# Patient Record
Sex: Male | Born: 1975 | Race: White | Hispanic: No | Marital: Married | Smoking: Former smoker
Health system: Southern US, Community
[De-identification: ages and names within clinical notes are randomized; demographics above are authoritative.]

## PROBLEM LIST (undated history)

## (undated) DIAGNOSIS — R809 Proteinuria, unspecified: Secondary | ICD-10-CM

## (undated) DIAGNOSIS — N529 Male erectile dysfunction, unspecified: Secondary | ICD-10-CM

## (undated) DIAGNOSIS — L309 Dermatitis, unspecified: Secondary | ICD-10-CM

## (undated) DIAGNOSIS — E785 Hyperlipidemia, unspecified: Secondary | ICD-10-CM

## (undated) DIAGNOSIS — M329 Systemic lupus erythematosus, unspecified: Secondary | ICD-10-CM

## (undated) DIAGNOSIS — N059 Unspecified nephritic syndrome with unspecified morphologic changes: Secondary | ICD-10-CM

## (undated) DIAGNOSIS — I10 Essential (primary) hypertension: Secondary | ICD-10-CM

## (undated) DIAGNOSIS — J189 Pneumonia, unspecified organism: Secondary | ICD-10-CM

## (undated) HISTORY — DX: Essential (primary) hypertension: I10

## (undated) HISTORY — DX: Proteinuria, unspecified: R80.9

## (undated) HISTORY — DX: Systemic lupus erythematosus, unspecified: M32.9

## (undated) HISTORY — DX: Dermatitis, unspecified: L30.9

## (undated) HISTORY — DX: Hyperlipidemia, unspecified: E78.5

## (undated) HISTORY — PX: NO PAST SURGERIES: SHX2092

## (undated) HISTORY — DX: Male erectile dysfunction, unspecified: N52.9

## (undated) HISTORY — PX: RENAL BIOPSY: SHX156

## (undated) HISTORY — DX: Unspecified nephritic syndrome with unspecified morphologic changes: N05.9

---

## 2007-02-23 ENCOUNTER — Emergency Department: Payer: Self-pay | Admitting: Emergency Medicine

## 2010-02-04 DIAGNOSIS — N289 Disorder of kidney and ureter, unspecified: Secondary | ICD-10-CM | POA: Insufficient documentation

## 2010-10-03 ENCOUNTER — Other Ambulatory Visit: Payer: Self-pay | Admitting: Nephrology

## 2010-10-05 ENCOUNTER — Observation Stay: Payer: Self-pay | Admitting: Nephrology

## 2011-03-29 DIAGNOSIS — N052 Unspecified nephritic syndrome with diffuse membranous glomerulonephritis: Secondary | ICD-10-CM | POA: Insufficient documentation

## 2012-04-03 DIAGNOSIS — Z79899 Other long term (current) drug therapy: Secondary | ICD-10-CM | POA: Insufficient documentation

## 2015-06-23 DIAGNOSIS — M3214 Glomerular disease in systemic lupus erythematosus: Secondary | ICD-10-CM | POA: Insufficient documentation

## 2016-01-15 ENCOUNTER — Ambulatory Visit (INDEPENDENT_AMBULATORY_CARE_PROVIDER_SITE_OTHER): Payer: PRIVATE HEALTH INSURANCE | Admitting: Family Medicine

## 2016-01-15 ENCOUNTER — Other Ambulatory Visit: Payer: Self-pay

## 2016-01-15 ENCOUNTER — Ambulatory Visit
Admission: RE | Admit: 2016-01-15 | Discharge: 2016-01-15 | Disposition: A | Discharge status: Home / Self Care | Payer: 59 | Source: Ambulatory Visit | Attending: Family Medicine | Admitting: Family Medicine

## 2016-01-15 VITALS — BP 120/78 | HR 76 | Temp 98.5°F | Resp 16 | Wt 175.2 lb

## 2016-01-15 DIAGNOSIS — M3214 Glomerular disease in systemic lupus erythematosus: Secondary | ICD-10-CM | POA: Diagnosis not present

## 2016-01-15 DIAGNOSIS — R030 Elevated blood-pressure reading, without diagnosis of hypertension: Secondary | ICD-10-CM

## 2016-01-15 DIAGNOSIS — M25561 Pain in right knee: Secondary | ICD-10-CM

## 2016-01-15 DIAGNOSIS — R609 Edema, unspecified: Secondary | ICD-10-CM | POA: Insufficient documentation

## 2016-01-15 DIAGNOSIS — IMO0001 Reserved for inherently not codable concepts without codable children: Secondary | ICD-10-CM | POA: Insufficient documentation

## 2016-01-15 DIAGNOSIS — Z23 Encounter for immunization: Secondary | ICD-10-CM

## 2016-01-15 DIAGNOSIS — N529 Male erectile dysfunction, unspecified: Secondary | ICD-10-CM | POA: Diagnosis not present

## 2016-01-15 DIAGNOSIS — L309 Dermatitis, unspecified: Secondary | ICD-10-CM | POA: Insufficient documentation

## 2016-01-15 MED ORDER — SILDENAFIL CITRATE 20 MG PO TABS: ORAL_TABLET | ORAL | 0 refills | Status: DC

## 2016-01-15 NOTE — Progress Notes (Signed)
Patient: Timothy Best Male    DOB: 23-Jun-1975   40 y.o.   MRN: [id number] Visit Date: 01/15/2016  Today's Provider: Vernie Murders, PA   Chief Complaint  Patient presents with  . Knee Pain   Subjective:    Knee Pain   The incident occurred more than 1 week ago. The injury mechanism is unknown. The pain is present in the right knee. The quality of the pain is described as aching. The pain has been constant since onset. The symptoms are aggravated by weight bearing and movement. He has tried elevation, ice, rest and NSAIDs for the symptoms. The treatment provided mild relief.   Erectile Dysfunction Having difficulty attaining and maintaining erections over the past couple months.    Previous Medications   ATORVASTATIN (LIPITOR) 20 MG TABLET    Take by mouth.   BENAZEPRIL (LOTENSIN) 20 MG TABLET    Take by mouth.   HYDROXYCHLOROQUINE (PLAQUENIL) 200 MG TABLET    Take by mouth.   MULTIPLE VITAMIN PO    Take by mouth.   MYCOPHENOLATE (CELLCEPT) 500 MG TABLET    Take by mouth.   TRIAMCINOLONE CREAM (KENALOG) 0.1 %    TRIAMCINOLONE ACETONIDE, 0.1% (External Cream)  1 Cream Cream apply 2-3 x day to rash for 0 days  Quantity: 60;  Refills: 1   Ordered :20-Sep-2010  Meyer Cory ;  Started 20-Sep-2010 Active   VERAPAMIL (CALAN-SR) 180 MG CR TABLET    Take by mouth.    Review of Systems  Constitutional: Negative.   Respiratory: Negative.   Cardiovascular: Negative.   Musculoskeletal: Positive for arthralgias and joint swelling.    Social History  Substance Use Topics  . Smoking status: Former Smoker    Packs/day: 1.00    Years: 12.00    Types: Cigarettes  . Smokeless tobacco: Never Used  . Alcohol use Yes     Comment: occasionally    Objective:   BP 120/78 (BP Location: Right Arm, Patient Position: Sitting, Cuff Size: Normal)   Pulse 76   Temp 98.5 F (36.9 C) (Oral)   Resp 16   Wt 175 lb 3.2 oz (79.5 kg)   Physical Exam  Constitutional: He is oriented to  person, place, and time. He appears well-developed and well-nourished. No distress.  HENT:  Head: Normocephalic and atraumatic.  Right Ear: Hearing normal.  Left Ear: Hearing normal.  Nose: Nose normal.  Eyes: Conjunctivae and lids are normal. Right eye exhibits no discharge. Left eye exhibits no discharge. No scleral icterus.  Pulmonary/Chest: Effort normal. No respiratory distress.  Musculoskeletal: He exhibits tenderness.  Right knee without ligament laxity. Slight tenderness along the medial side of patella. No click or popping to test ROM.  Neurological: He is alert and oriented to person, place, and time.  Skin: Skin is intact. No lesion and no rash noted.  Psychiatric: He has a normal mood and affect. His speech is normal and behavior is normal. Thought content normal.      Assessment & Plan:     1. Acute pain of right knee Onset 3 days ago when he was trying to carry a mattress up a flight of stairs. Had a previous episode of discomfort and swelling after pretending to be involved in combat during a play. Landed on the right knee several times and had pain develop after playing disc golf the next day or two, 2 weeks ago. Has tried a small amount of Ibuprofen and an knee brace with slight improvement.  Will get x-ray evaluation and continue brace. - DG Knee Complete 4 Views Right  2. SLE glomerulonephritis syndrome (HCC) Stable and followed by Dr. Candiss Norse (nephrologist). Still taking Cellcept and Plaquenil with Lotensin to preserve renal function. Follow up with nephrologist and rheumatologist regularly.  3. Erectile dysfunction, unspecified erectile dysfunction type Unable to maintain or attain erections intermittently over the past couple months. Feels this is worsening and would like to try Viagra. Given a few to try as long as he makes his nephrologist aware of it to be sure no contraindication since he is taking Cellcept and Plaquenil. Call report of response. - sildenafil  (REVATIO) 20 MG tablet; Take 1-3 tablets by mouth 1-4 hours prior to intercourse - Limit one dose per day.  Dispense: 10 tablet; Refill: 0  4. Need for influenza vaccination  - Flu Vaccine QUAD 36+ mos PF IM (Fluarix & Fluzone Quad PF)

## 2016-01-16 ENCOUNTER — Telehealth: Payer: Self-pay | Admitting: Family Medicine

## 2016-01-16 NOTE — Telephone Encounter (Signed)
Pt called saying he checked with his specialist regarding taking the sildenafil.  He said you can call it into [facility]  Pt's call back is [phone]  Thanks, Con Memos

## 2016-01-16 NOTE — Telephone Encounter (Signed)
Fill the prescription he was given yesterday to give it a try. If it works well, can get refill at [facility]. X-ray of knee showed some mild effusion from inflammation. No bony or joint abnormalities. Continue present brace for support and inflammation medicine if needed. Recheck knee in 7-10 days if no better.

## 2016-01-16 NOTE — Telephone Encounter (Signed)
Patient advised.

## 2016-01-22 ENCOUNTER — Other Ambulatory Visit: Payer: Self-pay | Admitting: Family Medicine

## 2016-01-22 DIAGNOSIS — N529 Male erectile dysfunction, unspecified: Secondary | ICD-10-CM

## 2016-05-18 ENCOUNTER — Other Ambulatory Visit: Payer: Self-pay | Admitting: Family Medicine

## 2016-05-18 DIAGNOSIS — N529 Male erectile dysfunction, unspecified: Secondary | ICD-10-CM

## 2016-12-23 ENCOUNTER — Encounter: Payer: Self-pay | Admitting: Family Medicine

## 2016-12-23 ENCOUNTER — Ambulatory Visit: Payer: PRIVATE HEALTH INSURANCE | Admitting: Family Medicine

## 2016-12-23 VITALS — BP 118/78 | HR 75 | Temp 98.5°F | Wt 193.0 lb

## 2016-12-23 DIAGNOSIS — J0100 Acute maxillary sinusitis, unspecified: Secondary | ICD-10-CM

## 2016-12-23 DIAGNOSIS — J029 Acute pharyngitis, unspecified: Secondary | ICD-10-CM | POA: Diagnosis not present

## 2016-12-23 LAB — POCT RAPID STREP A (OFFICE): RAPID STREP A SCREEN: NEGATIVE

## 2016-12-23 MED ORDER — AMOXICILLIN 875 MG PO TABS: 875.0000 mg | ORAL_TABLET | Freq: Two times a day (BID) | ORAL | 0 refills | Status: DC

## 2016-12-23 NOTE — Progress Notes (Signed)
Patient: Timothy Best Male    DOB: 1975/09/22   41 y.o.   MRN: [id number] Visit Date: 12/23/2016  Today's Provider: Vernie Murders, PA   Chief Complaint  Patient presents with  . URI   Subjective:    URI   This is a new problem. Episode onset: 2 days ago. Maximum temperature: 99.6. Associated symptoms include congestion, coughing and a sore throat. Associated symptoms comments: Fever . He has tried acetaminophen and NSAIDs (Mucinex DM) for the symptoms. The treatment provided mild relief.  Girlfriend started with similar symptoms 1 week ago but she is better now.  Patient Active Problem List   Diagnosis Date Noted  . Accumulation of fluid in tissues 01/15/2016  . Blood pressure elevated 01/15/2016  . SLE glomerulonephritis syndrome ([place]) 01/15/2016  . Dermatitis, eczematoid 01/15/2016  . 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 H/O long-term treatment with high-risk medication 04/03/2012  . 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 NO PAST SURGERIES     Family History  Problem Relation Age of Onset  . Allergies Mother   . Clotting disorder Father   . Allergies Brother      No Known Allergies  Current Outpatient Medications:  .  benazepril (LOTENSIN) 20 MG tablet, Take by mouth., Disp: , Rfl:  .  triamcinolone cream (KENALOG) 0.1 %, TRIAMCINOLONE ACETONIDE, 0.1% (External Cream)  1 Cream Cream apply 2-3 x day to rash for 0 days  Quantity: 60;  Refills: 1   Ordered :20-Sep-2010  Meyer Cory ;  Started 20-Sep-2010 Active, Disp: , Rfl:  .  atorvastatin (LIPITOR) 20 MG tablet, Take by mouth., Disp: , Rfl:  .  hydroxychloroquine (PLAQUENIL) 200 MG tablet, Take by mouth., Disp: , Rfl:  .  MULTIPLE VITAMIN PO, Take by mouth., Disp: , Rfl:  .  mycophenolate (CELLCEPT) 500 MG tablet, Take by mouth., Disp: , Rfl:  .  sildenafil (REVATIO) 20 MG tablet, TAKE 1-3 TABLETS BY MOUTH 1-4  HOURS PRIOR TO INTERCOURSE - LIMIT 1 DOSE PER DAY (Patient not taking: Reported on 12/23/2016), Disp: 10 tablet, Rfl: 3 .  verapamil (CALAN-SR) 180 MG CR tablet, Take by mouth., Disp: , Rfl:   Review of Systems  Constitutional: Positive for fever.  HENT: Positive for congestion and sore throat.   Respiratory: Positive for cough.   Cardiovascular: Negative.     Social History   Tobacco Use  . Smoking status: Former Smoker    Packs/day: 1.00    Years: 12.00    Pack years: 12.00    Types: Cigarettes  . Smokeless tobacco: Never Used  Substance Use Topics  . Alcohol use: Yes    Comment: occasionally    Objective:   BP 118/78 (BP Location: Right Arm, Patient Position: Sitting, Cuff Size: Normal)   Pulse 75   Temp 98.5 F (36.9 C) (Oral)   Wt 193 lb (87.5 kg)   SpO2 96%    Physical Exam  Constitutional: He is oriented to person, place, and time. He appears well-developed and well-nourished.  HENT:  Head: Normocephalic.  Right Ear: External ear normal.  Left Ear: External ear normal.  Nose: Nose normal.  Red posterior pharynx and right tonsillar pillar with questionable cyst versus exudate. Poor to no transillumination of the left maxillary sinus.  Eyes: Conjunctivae are normal.  Neck: Neck supple.  Cardiovascular: Normal rate and regular rhythm.  Pulmonary/Chest: Effort normal and breath sounds normal.  Abdominal:  Soft. Bowel sounds are normal.  Lymphadenopathy:    He has no cervical adenopathy.  Neurological: He is alert and oriented to person, place, and time.  Psychiatric: He has a normal mood and affect. His behavior is normal. Thought content normal.      Assessment & Plan:     1. Sore throat Onset over the past few days after girlfriend developed fever with cough and sore throat, also. Strep test negative. Suspect secondary to the PND from sinusitis. May use saltwater gargles prn. Recheck prn. - POCT rapid strep A  2. Acute maxillary sinusitis, recurrence not  specified Some low grade fever with scratchy sore throat and no transillumination of the left maxillary sinus. Treat with Mucinex-DM, Tylenol or Advil for fever or headache and Amoxil. Increase fluid intake and recheck if no better in 5-7 days. - amoxicillin (AMOXIL) 875 MG tablet; Take 1 tablet (875 mg total) 2 (two) times daily by mouth.  Dispense: 20 tablet; Refill: [place], [place]  [facility]

## 2017-06-19 ENCOUNTER — Other Ambulatory Visit: Payer: Self-pay | Admitting: Family Medicine

## 2017-06-19 DIAGNOSIS — N529 Male erectile dysfunction, unspecified: Secondary | ICD-10-CM

## 2017-06-24 ENCOUNTER — Encounter: Payer: Self-pay | Admitting: Family Medicine

## 2017-06-24 ENCOUNTER — Ambulatory Visit: Payer: PRIVATE HEALTH INSURANCE | Admitting: Family Medicine

## 2017-06-24 VITALS — BP 122/80 | HR 89 | Temp 98.7°F | Ht 72.0 in | Wt 195.2 lb

## 2017-06-24 DIAGNOSIS — L2082 Flexural eczema: Secondary | ICD-10-CM

## 2017-06-24 DIAGNOSIS — N529 Male erectile dysfunction, unspecified: Secondary | ICD-10-CM

## 2017-06-24 DIAGNOSIS — M3214 Glomerular disease in systemic lupus erythematosus: Secondary | ICD-10-CM

## 2017-06-24 LAB — CBC AND DIFFERENTIAL
HEMATOCRIT: 44 (ref 41–53)
HEMOGLOBIN: 15.2 (ref 13.5–17.5)
NEUTROS ABS: 4333
PLATELETS: 217 (ref 150–399)
WBC: 6.9

## 2017-06-24 MED ORDER — SILDENAFIL CITRATE 20 MG PO TABS: ORAL_TABLET | ORAL | 3 refills | Status: DC

## 2017-06-24 NOTE — Progress Notes (Signed)
Patient: Timothy Best Male    DOB: 1975-12-31   42 y.o.   MRN: [id number] Visit Date: 06/24/2017  Today's Provider: Vernie Murders, PA   Chief Complaint  Patient presents with  . Erectile Dysfunction    follow up    Subjective:    HPI Erectile Dysfunction Follow Up:  Patient presents for a follow up. Last follow up OV was on 01/15/2016. Patient was started on Sildenafil 20 mg  due to difficulty attaining and maintaining erections for a couple months prior. Patient reports medication helps with symptoms. Patient would also like to discuss labs he had done that were ordered by Nephrologist.   Past Medical History:  Diagnosis Date  . 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 Accumulation of fluid in tissues 01/15/2016  . Blood pressure elevated 01/15/2016  . SLE glomerulonephritis syndrome ([place]) 01/15/2016  . Dermatitis, eczematoid 01/15/2016  . 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 H/O long-term treatment with high-risk medication 04/03/2012  . 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 NO PAST SURGERIES     Family History  Problem Relation Age of Onset  . Allergies Mother   . Clotting disorder Father   . Allergies Brother    No Known Allergies  Current Outpatient Medications:  .  benazepril (LOTENSIN) 20 MG tablet, Take by mouth., Disp: , Rfl:  .  sildenafil (REVATIO) 20 MG tablet, TAKE 1-3 TABLETS BY MOUTH 1-4 HOURS PRIOR TO INTERCOURSE - LIMIT 1 DOSE PER DAY, Disp: 10 tablet, Rfl: 3 .  atorvastatin (LIPITOR) 20 MG tablet, Take by mouth., Disp: , Rfl:  .  hydroxychloroquine (PLAQUENIL) 200 MG tablet, Take by mouth., Disp: , Rfl:  .  MULTIPLE VITAMIN PO, Take by mouth., Disp: , Rfl:  .  mycophenolate (CELLCEPT) 500 MG tablet, Take by mouth., Disp: , Rfl:  .  triamcinolone cream (KENALOG) 0.1 %, TRIAMCINOLONE ACETONIDE, 0.1% (External  Cream)  1 Cream Cream apply 2-3 x day to rash for 0 days  Quantity: 60;  Refills: 1   Ordered :20-Sep-2010  Meyer Cory ;  Started 20-Sep-2010 Active, Disp: , Rfl:  .  verapamil (CALAN-SR) 180 MG CR tablet, Take by mouth., Disp: , Rfl:   Review of Systems  Constitutional: Negative.   Respiratory: Negative.   Cardiovascular: Negative.    Social History   Tobacco Use  . Smoking status: Former Smoker    Packs/day: 1.00    Years: 12.00    Pack years: 12.00    Types: Cigarettes  . Smokeless tobacco: Never Used  Substance Use Topics  . Alcohol use: Yes    Comment: occasionally    Objective:   BP 122/80 (BP Location: Right Arm, Patient Position: Sitting, Cuff Size: Normal)   Pulse 89   Temp 98.7 F (37.1 C) (Oral)   Ht 6' (1.829 m)   Wt 195 lb 3.2 oz (88.5 kg)   SpO2 97%   BMI 26.47 kg/m   Physical Exam  Constitutional: He is oriented to person, place, and time. He appears well-developed and well-nourished. No distress.  HENT:  Head: Normocephalic and atraumatic.  Right Ear: Hearing normal.  Left Ear: Hearing normal.  Nose: Nose normal.  Eyes: Conjunctivae and lids are normal. Right eye exhibits no discharge. Left eye exhibits no discharge. No scleral icterus.  Cardiovascular: Normal rate and regular rhythm.  Pulmonary/Chest: Effort normal and breath sounds  normal. No respiratory distress.  Abdominal: Soft. Bowel sounds are normal.  Musculoskeletal: Normal range of motion.  Neurological: He is alert and oriented to person, place, and time.  Skin: Skin is intact. Rash noted. No lesion noted.  Slightly raised pink papular pruritic rash on the right upper inner arm.  Psychiatric: He has a normal mood and affect. His speech is normal and behavior is normal. Thought content normal.      Assessment & Plan:     1. SLE glomerulonephritis syndrome (HCC) Dr. Candiss Norse (nephrologist) has found a flare in nephritis recently and considering if he needs further treatment with  Cellcept (worked well to get this in remission for 5-6 years in the past). Recheck TSH and CMP. The patient will bring report of recent labs by the nephrologist for comparison. He is feeling a little anxious and may need Buspar or Vistaril for prn usage for stress. - TSH - Comprehensive metabolic panel  2. Flexural eczema Intermittent itchy pink rash on flexor surfaces of elbows or upper inner arms. Slight rash on the right upper inner arm today. Will check labs. States he is getting very little relief from Hydrocortisone cream. May need Depo-Medrol injection if no relief from antihistamine cream or tablet. Follow up pending reports. - TSH - Comprehensive metabolic panel  3. Erectile dysfunction, unspecified erectile dysfunction type Occasionally has difficulty maintaining or attaining erections. Will check testosterone, CMP, PSA and refill sildenafil prescription. Denies significant changes in urinary flow, frequency and no hesitancy or hematuria. No family history of prostate cancers. - PSA - Comprehensive metabolic panel - Testosterone - sildenafil (REVATIO) 20 MG tablet; TAKE 1-3 TABLETS BY MOUTH 1-4 HOURS PRIOR TO INTERCOURSE - LIMIT 1 DOSE PER DAY  Dispense: 30 tablet; Refill: [place], [facility]

## 2017-06-26 ENCOUNTER — Other Ambulatory Visit: Payer: Self-pay

## 2017-06-26 ENCOUNTER — Other Ambulatory Visit: Payer: Self-pay | Admitting: Nephrology

## 2017-06-26 ENCOUNTER — Other Ambulatory Visit
Admission: RE | Admit: 2017-06-26 | Discharge: 2017-06-26 | Disposition: A | Discharge status: Home / Self Care | Payer: 59 | Source: Ambulatory Visit | Attending: Nephrology | Admitting: Nephrology

## 2017-06-26 DIAGNOSIS — R809 Proteinuria, unspecified: Secondary | ICD-10-CM | POA: Diagnosis not present

## 2017-06-26 LAB — COMPREHENSIVE METABOLIC PANEL
ALT: 31 IU/L (ref 0–44)
ALT: 33 U/L (ref 17–63)
ANION GAP: 9 (ref 5–15)
AST: 26 IU/L (ref 0–40)
AST: 24 U/L (ref 15–41)
Albumin/Globulin Ratio: 1.7 (ref 1.2–2.2)
Albumin: 3.5 g/dL (ref 3.5–5.5)
Albumin: 3.7 g/dL (ref 3.5–5.0)
Alkaline Phosphatase: 68 IU/L (ref 39–117)
Alkaline Phosphatase: 64 U/L (ref 38–126)
BUN/Creatinine Ratio: 19 (ref 9–20)
BUN: 18 mg/dL (ref 6–24)
BUN: 21 mg/dL — ABNORMAL HIGH (ref 6–20)
Bilirubin Total: 0.3 mg/dL (ref 0.0–1.2)
CALCIUM: 9.5 mg/dL (ref 8.7–10.2)
CHLORIDE: 101 mmol/L (ref 101–111)
CO2: 25 mmol/L (ref 20–29)
CO2: 29 mmol/L (ref 22–32)
CREATININE: 0.96 mg/dL (ref 0.76–1.27)
Calcium: 9.6 mg/dL (ref 8.9–10.3)
Chloride: 102 mmol/L (ref 96–106)
Creatinine, Ser: 0.89 mg/dL (ref 0.61–1.24)
GFR calc Af Amer: >60 mL/min (ref >60)
GFR, EST AFRICAN AMERICAN: 111 mL/min/{1.73_m2} (ref >59)
GFR, EST NON AFRICAN AMERICAN: 96 mL/min/{1.73_m2} (ref >59)
GLOBULIN, TOTAL: 2.1 g/dL (ref 1.5–4.5)
Glucose, Bld: 96 mg/dL (ref 65–99)
Glucose: 84 mg/dL (ref 65–99)
POTASSIUM: 4.5 mmol/L (ref 3.5–5.2)
POTASSIUM: 4.5 mmol/L (ref 3.5–5.1)
SODIUM: 139 mmol/L (ref 135–145)
Sodium: 142 mmol/L (ref 134–144)
TOTAL PROTEIN: 5.6 g/dL — AB (ref 6.0–8.5)
Total Bilirubin: 0.6 mg/dL (ref 0.3–1.2)
Total Protein: 6.8 g/dL (ref 6.5–8.1)

## 2017-06-26 LAB — URINALYSIS, COMPLETE (UACMP) WITH MICROSCOPIC
BILIRUBIN URINE: NEGATIVE
Bacteria, UA: NONE SEEN
Glucose, UA: NEGATIVE mg/dL
HGB URINE DIPSTICK: NEGATIVE
KETONES UR: NEGATIVE mg/dL
LEUKOCYTES UA: NEGATIVE
NITRITE: NEGATIVE
PH: 6 (ref 5.0–8.0)
Protein, ur: >=300 mg/dL — AB
SPECIFIC GRAVITY, URINE: 1.017 (ref 1.005–1.030)
Squamous Epithelial / LPF: NONE SEEN (ref 0–5)

## 2017-06-26 LAB — PSA: PROSTATE SPECIFIC AG, SERUM: 1.6 ng/mL (ref 0.0–4.0)

## 2017-06-26 LAB — CBC WITH DIFFERENTIAL/PLATELET
BASOS ABS: 0 10*3/uL (ref 0–0.1)
BASOS PCT: 1 %
Eosinophils Absolute: 0.3 10*3/uL (ref 0–0.7)
Eosinophils Relative: 5 %
HEMATOCRIT: 50.4 % (ref 40.0–52.0)
Hemoglobin: 17 g/dL (ref 13.0–18.0)
LYMPHS PCT: 18 %
Lymphs Abs: 1.1 10*3/uL (ref 1.0–3.6)
MCH: 29.9 pg (ref 26.0–34.0)
MCHC: 33.8 g/dL (ref 32.0–36.0)
MCV: 88.5 fL (ref 80.0–100.0)
MONO ABS: 0.7 10*3/uL (ref 0.2–1.0)
Monocytes Relative: 12 %
NEUTROS PCT: 64 %
Neutro Abs: 4.1 10*3/uL (ref 1.4–6.5)
Platelets: 267 10*3/uL (ref 150–440)
RBC: 5.69 MIL/uL (ref 4.40–5.90)
RDW: 13.2 % (ref 11.5–14.5)
WBC: 6.3 10*3/uL (ref 3.8–10.6)

## 2017-06-26 LAB — TSH: TSH: 2.55 u[IU]/mL (ref 0.450–4.500)

## 2017-06-26 LAB — TYPE AND SCREEN
ABO/RH(D): A NEG
Antibody Screen: NEGATIVE

## 2017-06-26 LAB — PROTIME-INR
INR: 0.86
Prothrombin Time: 11.6 seconds (ref 11.4–15.2)

## 2017-06-26 LAB — TESTOSTERONE: Testosterone: 280 ng/dL (ref 264–916)

## 2017-06-26 NOTE — Telephone Encounter (Signed)
-----   Message from [place], [place] sent at 06/26/2017  7:54 AM EDT ----- All tests normal except protein level a little below normal. Continue present medication regimen and recheck annually. Continue follow up with rheumatologist as planned.

## 2017-06-26 NOTE — Telephone Encounter (Signed)
Patient advised. He states he discussed at his OV about possibly getting a steroid injection for the lupus rash flare. He wants to know if he can still get that done. Please advise?

## 2017-06-26 NOTE — Telephone Encounter (Signed)
If Dr. Candiss Norse feels this would not hinder the use of Cellcept, we can schedule injection. May use Buspar 10 mg BID #30 for stress/anxiety (which pharmacy).

## 2017-06-27 ENCOUNTER — Observation Stay: Payer: 59

## 2017-06-27 ENCOUNTER — Other Ambulatory Visit: Payer: Self-pay

## 2017-06-27 ENCOUNTER — Observation Stay
Admission: RE | Admit: 2017-06-27 | Discharge: 2017-06-28 | Disposition: A | Discharge status: Home / Self Care | Payer: 59 | Source: Ambulatory Visit | Attending: Nephrology | Admitting: Nephrology

## 2017-06-27 DIAGNOSIS — M3214 Glomerular disease in systemic lupus erythematosus: Principal | ICD-10-CM | POA: Insufficient documentation

## 2017-06-27 DIAGNOSIS — R809 Proteinuria, unspecified: Secondary | ICD-10-CM | POA: Diagnosis present

## 2017-06-27 DIAGNOSIS — N269 Renal sclerosis, unspecified: Secondary | ICD-10-CM | POA: Insufficient documentation

## 2017-06-27 DIAGNOSIS — N261 Atrophy of kidney (terminal): Secondary | ICD-10-CM | POA: Diagnosis not present

## 2017-06-27 DIAGNOSIS — N052 Unspecified nephritic syndrome with diffuse membranous glomerulonephritis: Secondary | ICD-10-CM | POA: Diagnosis not present

## 2017-06-27 LAB — CBC WITH DIFFERENTIAL/PLATELET
BASOS ABS: 0 10*3/uL (ref 0–0.1)
Basophils Relative: 0 %
EOS PCT: 1 %
Eosinophils Absolute: 0 10*3/uL (ref 0–0.7)
HEMATOCRIT: 45.6 % (ref 40.0–52.0)
Hemoglobin: 16.1 g/dL (ref 13.0–18.0)
Lymphocytes Relative: 9 %
Lymphs Abs: 0.7 10*3/uL — ABNORMAL LOW (ref 1.0–3.6)
MCH: 31.1 pg (ref 26.0–34.0)
MCHC: 35.4 g/dL (ref 32.0–36.0)
MCV: 88 fL (ref 80.0–100.0)
MONO ABS: 0.1 10*3/uL — AB (ref 0.2–1.0)
Monocytes Relative: 2 %
Neutro Abs: 7.6 10*3/uL — ABNORMAL HIGH (ref 1.4–6.5)
Neutrophils Relative %: 88 %
PLATELETS: 241 10*3/uL (ref 150–440)
RBC: 5.19 MIL/uL (ref 4.40–5.90)
RDW: 13 % (ref 11.5–14.5)
WBC: 8.5 10*3/uL (ref 3.8–10.6)

## 2017-06-27 LAB — APTT: aPTT: 28 seconds (ref 24–36)

## 2017-06-27 MED ORDER — METHYLPREDNISOLONE SODIUM SUCC 1000 MG IJ SOLR
1000.0000 mg | Freq: Every day | INTRAMUSCULAR | Status: AC
  Administered 2017-06-27 – 2017-06-28 (×2): 1000 mg via INTRAVENOUS
  Filled 2017-06-27 (×2): qty 8

## 2017-06-27 MED ORDER — SODIUM CHLORIDE 0.9 % IV SOLN
INTRAVENOUS | Status: DC
  Administered 2017-06-27: 10:00:00 via INTRAVENOUS

## 2017-06-27 MED ORDER — SODIUM CHLORIDE 0.9 % IV SOLN: INTRAVENOUS | Status: DC

## 2017-06-27 MED ORDER — ONDANSETRON HCL 4 MG/2ML IJ SOLN: 4.0000 mg | Freq: Four times a day (QID) | INTRAMUSCULAR | Status: DC | PRN

## 2017-06-27 NOTE — Procedures (Signed)
  PROCEDURE: Informed written consent was obtained from the patient after a discussion of the risks, benefits and alternatives to treatment. The patient understands and consents the procedure. A timeout was performed prior to the initiation of the procedure.  Ultrasound scanning was performed of the bilateral flanks. The inferior pole of the LEFT kidney was selected for biopsy due to location and sonographic window. The procedure was planned. The operative site was prepped and draped in the usual sterile fashion. The overlying soft tissues were anesthetized with 10 mL of 1% XYLOCAINE.  A 18 gauge core needle biopsy device was advanced into the inferior cortex of the LEFT kidney and 3 core biopsies were obtained under direct ultrasound guidance. Real time pathologic review confirmed adequate tissue acquisition. Images were saved for documentation purposes. The biopsy device was removed and hemostasis was obtained with manual compression. Post procedural scanning was negative for significant post procedural hemorrhage or additional complication. A dressing was placed. The patient tolerated the procedure well without immediate post procedural complication.  

## 2017-06-27 NOTE — H&P (Signed)
[facility], [place] 06/27/17  Subjective:   Patient admittied for kidney biopsy  Objective:  Vital signs in last 24 hours:  Temp:  [98.7 F (37.1 C)] 98.7 F (37.1 C) (05/24 0710) Pulse Rate:  [73-83] 73 (05/24 0826) Resp:  [19-20] 19 (05/24 0800) BP: (127-134)/(82-92) 134/82 (05/24 0826) SpO2:  [95 %-97 %] 96 % (05/24 0826)  Weight change:  There were no vitals filed for this visit.  Intake/Output:    Intake/Output Summary (Last 24 hours) at 06/27/2017 0849 Last data filed at 06/27/2017 0700 Gross per 24 hour  Intake 0 ml  Output 0 ml  Net 0 ml     Physical Exam: General: NAD  HEENT ANicteric, moist mucus membranes  Neck Supple  Pulm/lungs Clear b/l  [facility]S/Heart regular  Abdomen:  Soft, NT  Extremities: No edema  Neurologic: Alert, oreinted  Skin: Diffuse rash          Basic Metabolic Panel:  Recent Labs  Lab 06/25/17 0857 06/26/17 1607  NA 142 139  K 4.5 4.5  CL 102 101  CO2 25 29  GLUCOSE 84 96  BUN 18 21*  CREATININE 0.96 0.89  CALCIUM 9.5 9.6     CBC: Recent Labs  Lab 06/24/17 06/26/17 1607  WBC 6.9 6.3  NEUTROABS 4,333 4.1  HGB 15.2 17.0  HCT 44 50.4  MCV  --  88.5  PLT 217 267     No results found for: HEPBSAG, HEPBSAB, HEPBIGM    Microbiology:  No results found for this or any previous visit (from the past 240 hour(s)).  Coagulation Studies: Recent Labs    06/26/17 1607  LABPROT 11.6  INR 0.86    Urinalysis: Recent Labs    06/26/17 1554  COLORURINE YELLOW*  LABSPEC 1.017  PHURINE 6.0  GLUCOSEU NEGATIVE  HGBUR NEGATIVE  BILIRUBINUR NEGATIVE  KETONESUR NEGATIVE  PROTEINUR >=300*  NITRITE NEGATIVE  LEUKOCYTESUR NEGATIVE      Imaging: No results found.   Medications:   . 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 Any transcription errors are unintentional

## 2017-06-28 DIAGNOSIS — M3214 Glomerular disease in systemic lupus erythematosus: Secondary | ICD-10-CM | POA: Diagnosis not present

## 2017-06-28 LAB — CBC
HEMATOCRIT: 45.8 % (ref 40.0–52.0)
HEMOGLOBIN: 15.5 g/dL (ref 13.0–18.0)
MCH: 29.9 pg (ref 26.0–34.0)
MCHC: 33.8 g/dL (ref 32.0–36.0)
MCV: 88.5 fL (ref 80.0–100.0)
Platelets: 228 10*3/uL (ref 150–440)
RBC: 5.17 MIL/uL (ref 4.40–5.90)
RDW: 13.2 % (ref 11.5–14.5)
WBC: 12.3 10*3/uL — ABNORMAL HIGH (ref 3.8–10.6)

## 2017-06-28 LAB — LIPID PANEL
CHOL/HDL RATIO: 6.8 ratio
Cholesterol: 307 mg/dL — ABNORMAL HIGH (ref 0–200)
HDL: 45 mg/dL (ref >40)
LDL CALC: 243 mg/dL — AB (ref 0–99)
Triglycerides: 96 mg/dL (ref <150)
VLDL: 19 mg/dL (ref 0–40)

## 2017-06-28 LAB — MPO/PR-3 (ANCA) ANTIBODIES: Myeloperoxidase Abs: <9 U/mL (ref 0.0–9.0)

## 2017-06-28 LAB — ENA+DNA/DS+ANTICH+CENTRO+JO...
Chromatin Ab SerPl-aCnc: <0.2 AI (ref 0.0–0.9)
ENA SM Ab Ser-aCnc: <0.2 AI (ref 0.0–0.9)
JO 1 IGG: 5.6 AI — AB (ref 0.0–0.9)
SSA (RO) (ENA) ANTIBODY, IGG: 6.5 AI — AB (ref 0.0–0.9)
SSB (LA) (ENA) ANTIBODY, IGG: 1.4 AI — AB (ref 0.0–0.9)
Scleroderma (Scl-70) (ENA) Antibody, IgG: <0.2 AI (ref 0.0–0.9)
ds DNA Ab: <1 IU/mL (ref 0–9)

## 2017-06-28 LAB — C4 COMPLEMENT: Complement C4, Body Fluid: 16 mg/dL (ref 14–44)

## 2017-06-28 LAB — ANA W/REFLEX IF POSITIVE: Anti Nuclear Antibody(ANA): POSITIVE — AB

## 2017-06-28 LAB — C3 COMPLEMENT: C3 Complement: 130 mg/dL (ref 82–167)

## 2017-06-28 MED ORDER — OXYCODONE HCL 5 MG PO TABS: 5.0000 mg | ORAL_TABLET | Freq: Once | ORAL | Status: DC

## 2017-06-28 MED ORDER — ACETAMINOPHEN 325 MG PO TABS
650.0000 mg | ORAL_TABLET | Freq: Four times a day (QID) | ORAL | Status: DC | PRN
  Administered 2017-06-28: 650 mg via ORAL
  Filled 2017-06-28: qty 2

## 2017-06-28 MED ORDER — TRIAMCINOLONE ACETONIDE 0.1 % EX CREA: TOPICAL_CREAM | Freq: Two times a day (BID) | CUTANEOUS | 0 refills | Status: AC

## 2017-06-28 MED ORDER — PREDNISONE 20 MG PO TABS: 20.0000 mg | ORAL_TABLET | Freq: Two times a day (BID) | ORAL | 0 refills | Status: DC

## 2017-06-28 MED ORDER — MELATONIN 5 MG PO TABS
5.0000 mg | ORAL_TABLET | Freq: Every day | ORAL | Status: DC
  Administered 2017-06-28: 5 mg via ORAL
  Filled 2017-06-28 (×2): qty 1

## 2017-06-28 NOTE — Progress Notes (Signed)
IV was removed. Discharge instructions were provided to the pt. All questions answered. The pt was taken downstairs via wheelchair by NT.

## 2017-06-28 NOTE — Discharge Summary (Signed)
Patient was admitted for kidney biopsy.  He underwent procedure successfully on 5/24.  He was given IV Solu-Medrol 1000 mg daily for 2 days. He was discharged in a stable condition on 5/25 See notes for full details

## 2017-06-28 NOTE — Progress Notes (Signed)
Timothy Best, [place] 06/28/17  Subjective:   Doing well No pain or hematuria No edema   Objective:  Vital signs in last 24 hours:  Temp:  [97.6 F (36.4 C)-98.3 F (36.8 C)] 97.6 F (36.4 C) (05/25 0539) Pulse Rate:  [57-75] 67 (05/25 0539) Resp:  [14-20] 20 (05/25 0539) BP: (119-132)/(76-86) 132/86 (05/25 0539) SpO2:  [96 %-99 %] 99 % (05/25 0539) Weight:  [88.5 kg (195 lb 1.7 oz)] 88.5 kg (195 lb 1.7 oz) (05/24 0957)  Weight change:  Filed Weights   06/27/17 0957  Weight: 88.5 kg (195 lb 1.7 oz)    Intake/Output:    Intake/Output Summary (Last 24 hours) at 06/28/2017 0934 Last data filed at 06/27/2017 1813 Gross per 24 hour  Intake 1290 ml  Output 600 ml  Net 690 ml     Physical Exam: General: NAD  HEENT Moist oral membranes  Neck supple  Pulm/lungs clear  CVS/Heart regular  Abdomen:  Soft; back has band aid- no pain  Extremities: No edema  Neurologic: Alert and oriented  Skin: Diffuse pink rash          Basic Metabolic Panel:  Recent Labs  Lab 06/25/17 0857 06/26/17 1607  NA 142 139  K 4.5 4.5  CL 102 101  CO2 25 29  GLUCOSE 84 96  BUN 18 21*  CREATININE 0.96 0.89  CALCIUM 9.5 9.6     CBC: Recent Labs  Lab 06/24/17 06/26/17 1607 06/27/17 1504 06/28/17 0614  WBC 6.9 6.3 8.5 12.3*  NEUTROABS 4,333 4.1 7.6*  --   HGB 15.2 17.0 16.1 15.5  HCT 44 50.4 45.6 45.8  MCV  --  88.5 88.0 88.5  PLT 217 267 241 228     No results found for: HEPBSAG, HEPBSAB, HEPBIGM    Microbiology:  No results found for this or any previous visit (from the past 240 hour(s)).  Coagulation Studies: Recent Labs    06/26/17 1607  LABPROT 11.6  INR 0.86    Urinalysis: Recent Labs    06/26/17 [place] 1.017  PHURINE 6.0  GLUCOSEU NEGATIVE  HGBUR NEGATIVE  BILIRUBINUR NEGATIVE  KETONESUR NEGATIVE  PROTEINUR >=300*  NITRITE NEGATIVE  LEUKOCYTESUR NEGATIVE      Imaging: US Biopsy-no  Radiologist  Result Date: 06/27/2017 INDICATION: Nephritis, proteinuria EXAM: ULTRASOUND GUIDED LEFT RENAL BIOPSY MEDICATIONS: None. ANESTHESIA/SEDATION: NONE PROCEDURE: Ultrasound-guided left renal biopsy performed by Dr. Candiss Norse without a radiologist present. Four core biopsies were performed of the inferior pole of the left kidney. COMPLICATIONS: Small perinephric hematoma on postprocedural imaging measuring 1.5 x 1 cm. FINDINGS: No renal mass.  Kidney is normal in size and echogenicity. IMPRESSION: Successful left renal biopsy performed by Dr. Candiss Norse without a radiologist present. CLINICAL DATA:  Proteinuria.  Nephritis. Electronically Signed   By: Kathreen Devoid   On: 06/27/2017 09:27     Medications:   . methylPREDNISolone (SOLU-MEDROL) injection 1,000 mg (06/28/17 2505)   . Melatonin  5 mg Oral QHS  . oxyCODONE  5 mg Oral Once   acetaminophen, ondansetron (ZOFRAN) IV  Assessment/ Plan:  42 y.o. male with nephrotic syndrame  Renal biopsy 06/27/17 D/c home We will call for follow up after biopsy results are available   LOS: 0 Piedad Standiford Candiss Norse 5/25/20199:34 AM  [place], [place]  Note: This note was prepared with Dragon dictation. Any transcription errors are unintentional

## 2017-06-30 LAB — ANCA TITERS
Atypical P-ANCA titer: <1:20 {titer}
C-ANCA: <1:20 {titer}
P-ANCA: <1:20 {titer}

## 2017-07-01 MED ORDER — BUSPIRONE HCL 10 MG PO TABS: 10.0000 mg | ORAL_TABLET | Freq: Two times a day (BID) | ORAL | 2 refills | Status: DC

## 2017-07-01 NOTE — Telephone Encounter (Signed)
Patient advised. He states he is willing to try the Buspar. [facility] Medication is pending in orders.  Patient states he will not need the injection now. He was in the hospital on Friday and Saturday having a kidney biopsy. He states he was put on a high dose of Prednisone, and he is now taking Prednisone 40 mg daily. He is following up with nephrologist, and depending on biopsy results he may be restarted on a autoimmune medication. He states he will keep Simona Huh updated and request records be sent to [place].

## 2017-07-03 LAB — QUANTIFERON-TB GOLD PLUS: QUANTIFERON-TB GOLD PLUS: UNDETERMINED

## 2017-07-03 LAB — QUANTIFERON-TB GOLD PLUS (RQFGPL)
QUANTIFERON NIL VALUE: 0.03 [IU]/mL
QUANTIFERON TB1 AG VALUE: 0.03 [IU]/mL
QUANTIFERON TB2 AG VALUE: 0.02 [IU]/mL
QuantiFERON Mitogen Value: 0.06 IU/mL

## 2017-07-08 LAB — SURGICAL PATHOLOGY

## 2017-07-09 ENCOUNTER — Encounter: Payer: Self-pay | Admitting: Nephrology

## 2017-07-23 ENCOUNTER — Other Ambulatory Visit: Payer: Self-pay | Admitting: Family Medicine

## 2017-07-30 ENCOUNTER — Encounter: Payer: Self-pay | Admitting: Family Medicine

## 2017-08-14 ENCOUNTER — Encounter: Payer: Self-pay | Admitting: Family Medicine

## 2017-08-14 NOTE — Telephone Encounter (Signed)
Please fax notes and labs to [facility] [phone].

## 2017-09-20 ENCOUNTER — Other Ambulatory Visit: Payer: Self-pay | Admitting: Family Medicine

## 2017-09-20 DIAGNOSIS — N529 Male erectile dysfunction, unspecified: Secondary | ICD-10-CM

## 2017-11-27 DIAGNOSIS — Y92411 Interstate highway as the place of occurrence of the external cause: Secondary | ICD-10-CM | POA: Insufficient documentation

## 2017-11-27 DIAGNOSIS — S161XXA Strain of muscle, fascia and tendon at neck level, initial encounter: Secondary | ICD-10-CM | POA: Insufficient documentation

## 2017-11-27 DIAGNOSIS — Y999 Unspecified external cause status: Secondary | ICD-10-CM | POA: Insufficient documentation

## 2017-11-27 DIAGNOSIS — Y9389 Activity, other specified: Secondary | ICD-10-CM | POA: Insufficient documentation

## 2017-11-27 DIAGNOSIS — S199XXA Unspecified injury of neck, initial encounter: Secondary | ICD-10-CM | POA: Diagnosis present

## 2017-11-27 DIAGNOSIS — Z87891 Personal history of nicotine dependence: Secondary | ICD-10-CM | POA: Diagnosis not present

## 2017-11-27 DIAGNOSIS — Z79899 Other long term (current) drug therapy: Secondary | ICD-10-CM | POA: Diagnosis not present

## 2017-11-27 LAB — CBC WITH DIFFERENTIAL/PLATELET
Abs Immature Granulocytes: 0.05 10*3/uL (ref 0.00–0.07)
BASOS ABS: 0 10*3/uL (ref 0.0–0.1)
Basophils Relative: 0 %
Eosinophils Absolute: 0.1 10*3/uL (ref 0.0–0.5)
Eosinophils Relative: 1 %
HEMATOCRIT: 42.3 % (ref 39.0–52.0)
Hemoglobin: 14.3 g/dL (ref 13.0–17.0)
IMMATURE GRANULOCYTES: 1 %
Lymphocytes Relative: 19 %
Lymphs Abs: 1.7 10*3/uL (ref 0.7–4.0)
MCH: 30.9 pg (ref 26.0–34.0)
MCHC: 33.8 g/dL (ref 30.0–36.0)
MCV: 91.4 fL (ref 80.0–100.0)
MONOS PCT: 11 %
Monocytes Absolute: 1 10*3/uL (ref 0.1–1.0)
NEUTROS ABS: 6 10*3/uL (ref 1.7–7.7)
NEUTROS PCT: 68 %
NRBC: 0 % (ref 0.0–0.2)
PLATELETS: 230 10*3/uL (ref 150–400)
RBC: 4.63 MIL/uL (ref 4.22–5.81)
RDW: 12.3 % (ref 11.5–15.5)
WBC: 8.8 10*3/uL (ref 4.0–10.5)

## 2017-11-27 LAB — TYPE AND SCREEN
ABO/RH(D): A NEG
ANTIBODY SCREEN: NEGATIVE

## 2017-11-27 NOTE — ED Notes (Signed)
C-collar placed on patient.
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
## 2017-11-28 ENCOUNTER — Emergency Department
Admission: EM | Admit: 2017-11-28 | Discharge: 2017-11-28 | Disposition: A | Discharge status: Home / Self Care | Payer: 59 | Attending: Emergency Medicine | Admitting: Emergency Medicine

## 2017-11-28 DIAGNOSIS — S161XXA Strain of muscle, fascia and tendon at neck level, initial encounter: Secondary | ICD-10-CM

## 2017-11-28 LAB — COMPREHENSIVE METABOLIC PANEL
ALBUMIN: 4.4 g/dL (ref 3.5–5.0)
ALK PHOS: 41 U/L (ref 38–126)
ALT: 24 U/L (ref 0–44)
AST: 16 U/L (ref 15–41)
Anion gap: 10 (ref 5–15)
BUN: 30 mg/dL — AB (ref 6–20)
CALCIUM: 9.9 mg/dL (ref 8.9–10.3)
CO2: 23 mmol/L (ref 22–32)
CREATININE: 1.15 mg/dL (ref 0.61–1.24)
Chloride: 104 mmol/L (ref 98–111)
GFR calc Af Amer: >60 mL/min (ref >60)
GFR calc non Af Amer: >60 mL/min (ref >60)
Glucose, Bld: 113 mg/dL — ABNORMAL HIGH (ref 70–99)
POTASSIUM: 3.7 mmol/L (ref 3.5–5.1)
SODIUM: 137 mmol/L (ref 135–145)
Total Bilirubin: 0.7 mg/dL (ref 0.3–1.2)
Total Protein: 6.7 g/dL (ref 6.5–8.1)

## 2017-11-28 LAB — LIPASE, BLOOD: Lipase: 25 U/L (ref 11–51)

## 2017-11-28 MED ORDER — HYDROCODONE-ACETAMINOPHEN 5-325 MG PO TABS
1.0000 | ORAL_TABLET | Freq: Once | ORAL | Status: AC
  Administered 2017-11-28: 1 via ORAL
  Filled 2017-11-28: qty 1

## 2017-11-28 MED ORDER — HYDROCODONE-ACETAMINOPHEN 5-325 MG PO TABS: 1.0000 | ORAL_TABLET | Freq: Four times a day (QID) | ORAL | 0 refills | Status: DC | PRN

## 2017-11-28 MED ORDER — IBUPROFEN 600 MG PO TABS
600.0000 mg | ORAL_TABLET | Freq: Once | ORAL | Status: AC
  Administered 2017-11-28: 600 mg via ORAL
  Filled 2017-11-28: qty 1

## 2017-11-28 MED ORDER — HYDROCODONE-ACETAMINOPHEN 5-325 MG PO TABS: 2.0000 | ORAL_TABLET | Freq: Once | ORAL | Status: DC

## 2017-11-28 MED ORDER — IBUPROFEN 600 MG PO TABS: 600.0000 mg | ORAL_TABLET | Freq: Three times a day (TID) | ORAL | 0 refills | Status: DC | PRN

## 2017-11-28 MED ORDER — CYCLOBENZAPRINE HCL 10 MG PO TABS
10.0000 mg | ORAL_TABLET | Freq: Once | ORAL | Status: AC
  Administered 2017-11-28: 10 mg via ORAL
  Filled 2017-11-28: qty 1

## 2017-11-28 MED ORDER — CYCLOBENZAPRINE HCL 10 MG PO TABS: 10.0000 mg | ORAL_TABLET | Freq: Three times a day (TID) | ORAL | 0 refills | Status: DC | PRN
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
## 2017-12-01 ENCOUNTER — Encounter: Payer: Self-pay | Admitting: Family Medicine

## 2017-12-01 ENCOUNTER — Ambulatory Visit (INDEPENDENT_AMBULATORY_CARE_PROVIDER_SITE_OTHER): Payer: PRIVATE HEALTH INSURANCE | Admitting: Family Medicine

## 2017-12-01 DIAGNOSIS — T07XXXA Unspecified multiple injuries, initial encounter: Secondary | ICD-10-CM | POA: Diagnosis not present

## 2017-12-01 DIAGNOSIS — S0990XA Unspecified injury of head, initial encounter: Secondary | ICD-10-CM | POA: Diagnosis not present

## 2017-12-01 DIAGNOSIS — N289 Disorder of kidney and ureter, unspecified: Secondary | ICD-10-CM | POA: Diagnosis not present

## 2017-12-01 DIAGNOSIS — S199XXA Unspecified injury of neck, initial encounter: Secondary | ICD-10-CM | POA: Diagnosis not present
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
## 2017-12-19 ENCOUNTER — Encounter: Payer: Self-pay | Admitting: Family Medicine

## 2017-12-19 ENCOUNTER — Ambulatory Visit
Admission: RE | Admit: 2017-12-19 | Discharge: 2017-12-19 | Disposition: A | Discharge status: Home / Self Care | Payer: No Typology Code available for payment source | Source: Ambulatory Visit | Attending: Family Medicine | Admitting: Family Medicine

## 2017-12-19 ENCOUNTER — Ambulatory Visit: Payer: PRIVATE HEALTH INSURANCE | Admitting: Family Medicine

## 2017-12-19 ENCOUNTER — Other Ambulatory Visit: Payer: Self-pay

## 2017-12-19 DIAGNOSIS — S0990XA Unspecified injury of head, initial encounter: Secondary | ICD-10-CM | POA: Diagnosis present

## 2017-12-19 DIAGNOSIS — S199XXA Unspecified injury of neck, initial encounter: Secondary | ICD-10-CM | POA: Insufficient documentation

## 2017-12-19 MED ORDER — CYCLOBENZAPRINE HCL 10 MG PO TABS: 10.0000 mg | ORAL_TABLET | Freq: Three times a day (TID) | ORAL | 0 refills | Status: DC | PRN
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
## 2017-12-22 ENCOUNTER — Other Ambulatory Visit: Payer: Self-pay | Admitting: Family Medicine

## 2017-12-22 DIAGNOSIS — S199XXA Unspecified injury of neck, initial encounter: Principal | ICD-10-CM

## 2017-12-22 DIAGNOSIS — S0990XA Unspecified injury of head, initial encounter: Secondary | ICD-10-CM

## 2017-12-22 MED ORDER — CYCLOBENZAPRINE HCL 10 MG PO TABS: 10.0000 mg | ORAL_TABLET | Freq: Three times a day (TID) | ORAL | 0 refills | Status: DC | PRN

## 2017-12-22 NOTE — Progress Notes (Signed)
Printed prescription by error. Sent to pharmacy today.

## 2018-01-16 ENCOUNTER — Other Ambulatory Visit: Payer: Self-pay | Admitting: Family Medicine

## 2018-01-30 ENCOUNTER — Encounter: Payer: Self-pay | Admitting: Family Medicine

## 2018-02-02 ENCOUNTER — Other Ambulatory Visit: Payer: Self-pay | Admitting: Family Medicine

## 2018-02-02 DIAGNOSIS — G2581 Restless legs syndrome: Secondary | ICD-10-CM

## 2018-02-02 MED ORDER — ROPINIROLE HCL 0.25 MG PO TABS: ORAL_TABLET | ORAL | 0 refills | Status: DC

## 2018-02-04 DIAGNOSIS — E079 Disorder of thyroid, unspecified: Secondary | ICD-10-CM

## 2018-02-04 HISTORY — DX: Disorder of thyroid, unspecified: E07.9

## 2018-02-24 ENCOUNTER — Other Ambulatory Visit: Payer: Self-pay | Admitting: Family Medicine

## 2018-02-24 DIAGNOSIS — G2581 Restless legs syndrome: Secondary | ICD-10-CM

## 2018-02-24 MED ORDER — ROPINIROLE HCL 0.5 MG PO TABS: ORAL_TABLET | ORAL | 3 refills | Status: DC

## 2018-03-04 ENCOUNTER — Ambulatory Visit: Payer: No Typology Code available for payment source | Admitting: Family Medicine

## 2018-03-04 ENCOUNTER — Encounter: Payer: Self-pay | Admitting: Family Medicine

## 2018-03-04 VITALS — BP 130/70 | HR 113 | Temp 100.5°F | Resp 18 | Wt 200.2 lb

## 2018-03-04 DIAGNOSIS — J111 Influenza due to unidentified influenza virus with other respiratory manifestations: Secondary | ICD-10-CM

## 2018-03-04 DIAGNOSIS — Z20828 Contact with and (suspected) exposure to other viral communicable diseases: Secondary | ICD-10-CM | POA: Diagnosis not present

## 2018-03-04 LAB — POCT INFLUENZA A/B
INFLUENZA B, POC: NEGATIVE
Influenza A, POC: NEGATIVE

## 2018-03-04 MED ORDER — HYDROCODONE-HOMATROPINE 5-1.5 MG/5ML PO SYRP: ORAL_SOLUTION | ORAL | 0 refills | Status: DC

## 2018-03-04 MED ORDER — OSELTAMIVIR PHOSPHATE 75 MG PO CAPS: 75.0000 mg | ORAL_CAPSULE | Freq: Two times a day (BID) | ORAL | 0 refills | Status: DC

## 2018-03-04 NOTE — Patient Instructions (Signed)
Work excuse for 1/29-1/31/2020.

## 2018-03-04 NOTE — Progress Notes (Signed)
  Subjective:     Patient ID: Timothy Best, male   DOB: 17-Jun-1975, 43 y.o.   MRN: [id number] Chief Complaint  Patient presents with  . Influenza    Patient comes in office today with concerns of exposure to Influenza A. Patient states that yesterday his wife tested positive for flu and for the past 24 hours he has had symptoms of headache, cough, sore throat, fever high of 102.9, body aches and fatigue.    HPI States he remains on immunosuppressant medication. No flu shot this season.  Review of Systems     Objective:   Physical Exam Constitutional:      General: He is not in acute distress.    Appearance: He is ill-appearing.  Neurological:     Mental Status: He is alert.   Ears: T.M's intact without inflammation Throat: no tonsillar enlargement or exudate Neck: no cervical adenopathy Lungs: clear     Assessment:    1. Exposure to influenza - POCT Influenza A/B  2. Influenza: written rx for Hycodan cough syrup and Tamiflu  (computer froze at time of visit)    Plan:    Work excuse for 1/29-1/31/2020

## 2018-04-06 NOTE — Progress Notes (Signed)
Patient: Timothy Best Male    DOB: 01/26/1976   43 y.o.   MRN: [id number] Visit Date: 04/07/2018  Today's Provider: Vernie Murders, PA   Chief Complaint  Patient presents with  . Shortness of Breath   Subjective:     HPI   Patient was seen 03/04/2018 by Mariel Sleet for flu. Patient was given Tamiflu. Patient states that he has lingering symptoms since then. Patient has symptoms of shortness of breath, chest tightness, low grade fever, congestion, and cough. Patient states cough is not productive. Patient has taking Tylenol for the low grade fever.  Past Medical History:  Diagnosis Date  . 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 NO PAST SURGERIES    . RENAL BIOPSY     Family History  Problem Relation Age of Onset  . Allergies Mother   . Clotting disorder Father   . Allergies Brother    No Known Allergies  Current Outpatient Medications:  .  atorvastatin (LIPITOR) 20 MG tablet, Take 20 mg by mouth daily., Disp: , Rfl:  .  benazepril (LOTENSIN) 20 MG tablet, Take by mouth., Disp: , Rfl:  .  busPIRone (BUSPAR) 10 MG tablet, TAKE 1 TABLET BY MOUTH TWICE A DAY, Disp: 180 tablet, Rfl: 1 .  hydroxychloroquine (PLAQUENIL) 200 MG tablet, Take 400 mg by mouth daily., Disp: , Rfl:  .  mycophenolate (CELLCEPT) 500 MG tablet, , Disp: , Rfl:  .  predniSONE (DELTASONE) 10 MG tablet, Take 40 mg by mouth daily., Disp: , Rfl:  .  sildenafil (REVATIO) 20 MG tablet, TAKE 1-3 TABLETS BY MOUTH 1-4 HOURS PRIOR TO INTERCOURSE - LIMIT 1 DOSE PER DAY, Disp: 90 tablet, Rfl: 1 .  rOPINIRole (REQUIP) 0.5 MG tablet, Take one tablet by mouth 1-3 hours prior to bedtime. (Patient not taking: Reported on 04/07/2018), Disp: 30 tablet, Rfl: 3 .  tacrolimus (PROGRAF) 1 MG capsule, Take 1 mg by mouth 2 (two) times daily. Pt reports he takes 2 capsules in the morning and 2 capsules in the evening, Disp: , Rfl:   Review of Systems  Constitutional: Positive for  fatigue. Negative for appetite change, chills and fever.  HENT: Positive for congestion. Negative for sinus pressure.   Respiratory: Positive for cough, chest tightness and shortness of breath. Negative for wheezing.   Cardiovascular: Negative for chest pain and palpitations.  Gastrointestinal: Negative for abdominal pain, nausea and vomiting.   Social History   Tobacco Use  . Smoking status: Former Smoker    Packs/day: 1.00    Years: 12.00    Pack years: 12.00    Types: Cigarettes  . Smokeless tobacco: Never Used  Substance Use Topics  . Alcohol use: Yes    Comment: occasionally      Objective:   BP 130/75 (BP Location: Right Arm, Patient Position: Sitting, Cuff Size: Normal)   Pulse (!) 112   Temp 99.6 F (37.6 C) (Oral)   Wt 188 lb (85.3 kg)   SpO2 99%   BMI 25.50 kg/m  Vitals:   04/07/18 0857  BP: 130/75  Pulse: (!) 112  Temp: 99.6 F (37.6 C)  TempSrc: Oral  SpO2: 99%  Weight: 188 lb (85.3 kg)   Physical Exam Constitutional:      General: He is not in acute distress.    Appearance: He is well-developed.  HENT:     Head: Normocephalic and atraumatic.     Right Ear:  Hearing normal.     Left Ear: Hearing normal.     Nose: Nose normal.  Eyes:     General: Lids are normal. No scleral icterus.       Right eye: No discharge.        Left eye: No discharge.     Conjunctiva/sclera: Conjunctivae normal.     Pupils: Pupils are equal, round, and reactive to light.  Neck:     Musculoskeletal: Normal range of motion and neck supple.  Cardiovascular:     Rate and Rhythm: Regular rhythm. Tachycardia present.     Heart sounds: Normal heart sounds.  Pulmonary:     Effort: Pulmonary effort is normal. No respiratory distress.     Breath sounds: Normal breath sounds. No decreased breath sounds, wheezing, rhonchi or rales.  Chest:     Chest wall: No mass or tenderness.  Abdominal:     General: Bowel sounds are normal.     Palpations: Abdomen is soft.  Musculoskeletal:  Normal range of motion.  Lymphadenopathy:     Cervical: No cervical adenopathy.  Skin:    Findings: No lesion or rash.  Neurological:     Mental Status: He is alert and oriented to person, place, and time.  Psychiatric:        Speech: Speech normal.        Behavior: Behavior normal.        Thought Content: Thought content normal.       Assessment & Plan    1. Intermittent FUO Has had temperature around 99.6 each evening since treatment for suspected influenza on 03-04-18. Some chest pressure and occasional aching in knees without swelling or redness of joints. No recent rash, cough or sore throat. Mild tachycardia. 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 Last TB test negative on 06-28-27. Will check labs to rule out RF, carditis, pulmonary infection, etc. Schedule labs and CXR. 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 Candiss Norse) and taking Cellcept, Plaquenil and Prednisone. No urinary tract symptoms today. Recheck CBC and CMP. - CBC with Differential/Platelet - Comprehensive metabolic panel     Vernie Murders, PA  [facility]

## 2018-04-07 ENCOUNTER — Other Ambulatory Visit: Payer: Self-pay

## 2018-04-07 ENCOUNTER — Ambulatory Visit
Admission: RE | Admit: 2018-04-07 | Discharge: 2018-04-07 | Disposition: A | Discharge status: Home / Self Care | Payer: No Typology Code available for payment source | Source: Ambulatory Visit | Attending: Family Medicine | Admitting: Family Medicine

## 2018-04-07 ENCOUNTER — Encounter: Payer: Self-pay | Admitting: Family Medicine

## 2018-04-07 ENCOUNTER — Ambulatory Visit (INDEPENDENT_AMBULATORY_CARE_PROVIDER_SITE_OTHER): Payer: No Typology Code available for payment source | Admitting: Family Medicine

## 2018-04-07 ENCOUNTER — Ambulatory Visit
Admission: RE | Admit: 2018-04-07 | Discharge: 2018-04-07 | Disposition: A | Discharge status: Home / Self Care | Payer: No Typology Code available for payment source | Attending: Family Medicine | Admitting: Family Medicine

## 2018-04-07 VITALS — BP 130/75 | HR 112 | Temp 99.6°F | Wt 188.0 lb

## 2018-04-07 DIAGNOSIS — R509 Fever, unspecified: Secondary | ICD-10-CM

## 2018-04-07 DIAGNOSIS — N052 Unspecified nephritic syndrome with diffuse membranous glomerulonephritis: Secondary | ICD-10-CM

## 2018-04-08 ENCOUNTER — Telehealth: Payer: Self-pay | Admitting: *Deleted

## 2018-04-08 LAB — COMPREHENSIVE METABOLIC PANEL
A/G RATIO: 1.2 (ref 1.2–2.2)
ALBUMIN: 3.3 g/dL — AB (ref 4.0–5.0)
ALT: 34 IU/L (ref 0–44)
AST: 20 IU/L (ref 0–40)
Alkaline Phosphatase: 91 IU/L (ref 39–117)
BUN/Creatinine Ratio: 28 — ABNORMAL HIGH (ref 9–20)
BUN: 26 mg/dL — ABNORMAL HIGH (ref 6–24)
Bilirubin Total: <0.2 mg/dL (ref 0.0–1.2)
CALCIUM: 9.6 mg/dL (ref 8.7–10.2)
CO2: 22 mmol/L (ref 20–29)
CREATININE: 0.92 mg/dL (ref 0.76–1.27)
Chloride: 99 mmol/L (ref 96–106)
GFR, EST AFRICAN AMERICAN: 117 mL/min/{1.73_m2} (ref >59)
GFR, EST NON AFRICAN AMERICAN: 101 mL/min/{1.73_m2} (ref >59)
GLOBULIN, TOTAL: 2.7 g/dL (ref 1.5–4.5)
Glucose: 99 mg/dL (ref 65–99)
Potassium: 4.6 mmol/L (ref 3.5–5.2)
SODIUM: 140 mmol/L (ref 134–144)
TOTAL PROTEIN: 6 g/dL (ref 6.0–8.5)

## 2018-04-08 LAB — CBC WITH DIFFERENTIAL/PLATELET
Basophils Absolute: 0 10*3/uL (ref 0.0–0.2)
Basos: 0 %
EOS (ABSOLUTE): 0.3 10*3/uL (ref 0.0–0.4)
EOS: 2 %
HEMATOCRIT: 33.4 % — AB (ref 37.5–51.0)
Hemoglobin: 10.9 g/dL — ABNORMAL LOW (ref 13.0–17.7)
IMMATURE GRANS (ABS): 0.1 10*3/uL (ref 0.0–0.1)
IMMATURE GRANULOCYTES: 1 %
LYMPHS: 12 %
Lymphocytes Absolute: 1.4 10*3/uL (ref 0.7–3.1)
MCH: 27.5 pg (ref 26.6–33.0)
MCHC: 32.6 g/dL (ref 31.5–35.7)
MCV: 84 fL (ref 79–97)
MONOCYTES: 12 %
Monocytes Absolute: 1.4 10*3/uL — ABNORMAL HIGH (ref 0.1–0.9)
NEUTROS PCT: 73 %
Neutrophils Absolute: 8.8 10*3/uL — ABNORMAL HIGH (ref 1.4–7.0)
Platelets: 390 10*3/uL (ref 150–450)
RBC: 3.96 x10E6/uL — AB (ref 4.14–5.80)
RDW: 11.7 % (ref 11.6–15.4)
WBC: 12 10*3/uL — AB (ref 3.4–10.8)

## 2018-04-08 LAB — ANTISTREPTOLYSIN O TITER: ASO: 53 IU/mL (ref 0.0–200.0)

## 2018-04-08 LAB — C-REACTIVE PROTEIN: CRP: 94 mg/L — AB (ref 0–10)

## 2018-04-08 LAB — TSH: TSH: 1.88 u[IU]/mL (ref 0.450–4.500)

## 2018-04-08 MED ORDER — LEVOFLOXACIN 500 MG PO TABS: 500.0000 mg | ORAL_TABLET | Freq: Every day | ORAL | 0 refills | Status: DC

## 2018-04-08 NOTE — Telephone Encounter (Signed)
Pt returned call ° °teri °

## 2018-04-08 NOTE — Telephone Encounter (Signed)
Patient was notified of results. Expressed understanding. Rx was sent to pharmacy. Patient wanted to know if Simona Huh recommends he get the CT scan of chest? Please advise?

## 2018-04-08 NOTE — Telephone Encounter (Signed)
-----   Message from Margo Common, [place] sent at 04/07/2018  6:04 PM EST ----- A vague opacity in the right mid lung would be a focal pneumonia and a right paratracheal opacity could be an enlarged lymph node or mediastinal mass. Radiologist recommended a CT scan of chest with contrast if labs show normal renal function. Probably should take antibiotic (Levofloxacin 500 mg qd #7) and recheck in a week.

## 2018-04-08 NOTE — Telephone Encounter (Signed)
LMOVM for pt to return call 

## 2018-04-09 NOTE — Telephone Encounter (Signed)
Yes, to evaluate lump finding as a growth or enlarged mediastinal lymph node enlargement from infection.

## 2018-04-10 ENCOUNTER — Other Ambulatory Visit: Payer: Self-pay | Admitting: Family Medicine

## 2018-04-10 ENCOUNTER — Telehealth: Payer: Self-pay

## 2018-04-10 DIAGNOSIS — J9859 Other diseases of mediastinum, not elsewhere classified: Secondary | ICD-10-CM

## 2018-04-10 DIAGNOSIS — R509 Fever, unspecified: Secondary | ICD-10-CM

## 2018-04-10 MED ORDER — IRON 325 (65 FE) MG PO TABS: 1.0000 | ORAL_TABLET | Freq: Two times a day (BID) | ORAL | 3 refills | Status: DC

## 2018-04-10 NOTE — Telephone Encounter (Signed)
Pt advised.  Rx sent to [facility].  Pt agreed to proceed with the CT Scan.    Thanks,   -Mickel Baas

## 2018-04-10 NOTE — Telephone Encounter (Signed)
CT scan future order placed in chart to be released to outpatient radiology office.

## 2018-04-10 NOTE — Telephone Encounter (Signed)
-----   Message from Margo Common, [place] sent at 04/10/2018  7:57 AM EST ----- Iron level very low and low hemoglobin indicates iron deficiency anemia. Inflammatory indicator - CRP high. Normal kidney function. Should schedule for CT of chest with and without contrast to evaluate perihilar lymph nodes and infiltrate. Also, need Ferous Sulfate 325 mg BID #60 and 3 RF to replace iron loss. Inflammation probably from infiltrate in lungs and autoimmune disorder.

## 2018-04-10 NOTE — Telephone Encounter (Signed)
LMTCB 04/10/2018  Thanks,   -Emmanuela Ghazi  

## 2018-04-13 ENCOUNTER — Other Ambulatory Visit: Payer: Self-pay | Admitting: Family Medicine

## 2018-04-13 ENCOUNTER — Encounter: Payer: Self-pay | Admitting: Family Medicine

## 2018-04-13 NOTE — Telephone Encounter (Signed)
Please advise 

## 2018-04-13 NOTE — Telephone Encounter (Signed)
LMOVM for pt to return call 

## 2018-04-13 NOTE — Telephone Encounter (Signed)
Patient was advised.  

## 2018-04-13 NOTE — Telephone Encounter (Signed)
Labs ordered and patient was notified.

## 2018-04-13 NOTE — Telephone Encounter (Signed)
Patient was advised. Please order blood cultures. Patient will come by office this afternoon to get labs done.

## 2018-04-13 NOTE — Telephone Encounter (Signed)
Pt called today saying he is still running a fever of 101.4 this morning at 5 am.  It os 99.6 at 8am.  Same symptons.    Please advise  CB# [phone]  Thanks Con Memos

## 2018-04-13 NOTE — Telephone Encounter (Signed)
Schedule for blood cultures x 2 for fever of unknown origin and proceed with CT scan of chest future order to be released.

## 2018-04-15 ENCOUNTER — Encounter: Payer: Self-pay | Admitting: Family Medicine

## 2018-04-20 ENCOUNTER — Encounter: Payer: Self-pay | Admitting: Family Medicine

## 2018-04-20 ENCOUNTER — Other Ambulatory Visit: Payer: Self-pay

## 2018-04-20 ENCOUNTER — Ambulatory Visit
Admission: RE | Admit: 2018-04-20 | Discharge: 2018-04-20 | Disposition: A | Discharge status: Home / Self Care | Payer: No Typology Code available for payment source | Source: Ambulatory Visit | Attending: Family Medicine | Admitting: Family Medicine

## 2018-04-20 ENCOUNTER — Other Ambulatory Visit: Payer: Self-pay | Admitting: Family Medicine

## 2018-04-20 DIAGNOSIS — R509 Fever, unspecified: Secondary | ICD-10-CM | POA: Diagnosis not present

## 2018-04-20 DIAGNOSIS — J9859 Other diseases of mediastinum, not elsewhere classified: Secondary | ICD-10-CM | POA: Insufficient documentation

## 2018-04-20 LAB — CULTURE, BLOOD (SINGLE)

## 2018-04-20 MED ORDER — IOHEXOL 300 MG/ML  SOLN
75.0000 mL | Freq: Once | INTRAMUSCULAR | Status: AC | PRN
  Administered 2018-04-20: 75 mL via INTRAVENOUS

## 2018-04-22 ENCOUNTER — Encounter: Payer: Self-pay | Admitting: *Deleted

## 2018-04-22 DIAGNOSIS — J948 Other specified pleural conditions: Secondary | ICD-10-CM

## 2018-04-22 DIAGNOSIS — R918 Other nonspecific abnormal finding of lung field: Secondary | ICD-10-CM | POA: Insufficient documentation

## 2018-04-22 NOTE — Progress Notes (Signed)
[place]  [phone] [phone]  ID: Timothy Best OB: 04-13-1975  MR#: [id number]  KPT#:[id number]  Patient Care Team: Margo Common, PA as PCP - General (Family Medicine) Telford Nab, RN as Registered Nurse  CHIEF COMPLAINT: Multiple right lung masses.  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  His symptoms not resolve which led to a CT scan revealing multiple lung masses of unclear etiology.  He recently was placed on antibiotics with improvement of his fevers and chills.  He continues to have some mild pleuritic chest pain as well as shortness of breath, but otherwise feels well.  He has no neurologic complaints.  He denies any cough or hemoptysis.  He has a good appetite and denies weight loss.  He has no nausea, vomiting, constipation, or diarrhea.  He has no urinary complaints.  Patient offers no further complaints today.  REVIEW OF SYSTEMS:   Review of Systems  Constitutional: Negative.  Negative for fever, malaise/fatigue and weight loss.  Respiratory: Positive for shortness of breath. Negative for cough and hemoptysis.   Cardiovascular: Negative.  Negative for chest pain and leg swelling.  Gastrointestinal: Negative.  Negative for abdominal pain.  Genitourinary: Negative.  Negative for dysuria.  Musculoskeletal: Negative.  Negative for back pain.  Skin: Negative.   Neurological: Negative.  Negative for dizziness, focal weakness, weakness and headaches.  Psychiatric/Behavioral: Negative.  The patient is not nervous/anxious.     As per HPI. Otherwise, a complete review of systems is negative.  PAST MEDICAL HISTORY: Past Medical History:  Diagnosis Date   Dermatitis    Erectile dysfunction    Glomerulonephritis    Hyperlipidemia    Hypertension    Proteinuria    Systemic  lupus ([place])     PAST SURGICAL HISTORY: Past Surgical History:  Procedure Laterality Date   NO PAST SURGERIES     RENAL BIOPSY      FAMILY HISTORY: Family History  Problem Relation Age of Onset   Allergies Mother    Clotting disorder Father    Allergies Brother     ADVANCED DIRECTIVES (Y/N):  N  HEALTH MAINTENANCE: Social History   Tobacco Use   Smoking status: Former Smoker    Packs/day: 1.00    Years: 12.00    Pack years: 12.00    Types: Cigarettes   Smokeless tobacco: Never Used  Substance Use Topics   Alcohol use: Yes    Comment: occasionally    Drug use: No     Colonoscopy:  PAP:  Bone density:  Lipid panel:  No Known Allergies  Current Outpatient Medications  Medication Sig Dispense Refill   atorvastatin (LIPITOR) 20 MG tablet Take 20 mg by mouth daily.     benazepril (LOTENSIN) 20 MG tablet Take by mouth.     Ferrous Sulfate (IRON) 325 (65 Fe) MG TABS Take 1 tablet (325 mg total) by mouth 2 (two) times daily. 60 each 3   hydroxychloroquine (PLAQUENIL) 200 MG tablet Take 400 mg by mouth daily.     mycophenolate (CELLCEPT) 500 MG tablet      torsemide (DEMADEX) 20 MG tablet Take 20 mg by mouth daily as needed.     levofloxacin (LEVAQUIN) 500 MG tablet Take 1 tablet (500 mg total) by mouth daily for 10 days. 10 tablet 0   No current facility-administered medications for this visit.     OBJECTIVE: Vitals:  04/24/18 0950  BP: 120/79  Pulse: (!) 104  Resp: 18  Temp: (!) 97 F (36.1 C)  SpO2: 99%     There is no height or weight on file to calculate BMI.    ECOG FS:0 - Asymptomatic  General: Well-developed, well-nourished, no acute distress. Eyes: Pink conjunctiva, anicteric sclera. HEENT: Normocephalic, moist mucous membranes, clear oropharnyx. Lungs: Clear to auscultation bilaterally. Heart: Regular rate and rhythm. No rubs, murmurs, or gallops. Abdomen: Soft, nontender, nondistended. No organomegaly noted, normoactive bowel  sounds. Musculoskeletal: No edema, cyanosis, or clubbing. Neuro: Alert, answering all questions appropriately. Cranial nerves grossly intact. Skin: No rashes or petechiae noted. Psych: Normal affect. Lymphatics: No cervical, calvicular, axillary or inguinal LAD.   LAB RESULTS:  Lab Results  Component Value Date   NA 140 04/07/2018   K 4.6 04/07/2018   CL 99 04/07/2018   CO2 22 04/07/2018   GLUCOSE 99 04/07/2018   BUN 26 (H) 04/07/2018   CREATININE 0.92 04/07/2018   CALCIUM 9.6 04/07/2018   PROT 6.0 04/07/2018   ALBUMIN 3.3 (L) 04/07/2018   AST 20 04/07/2018   ALT 34 04/07/2018   ALKPHOS 91 04/07/2018   BILITOT <0.2 04/07/2018   GFRNONAA 101 04/07/2018   GFRAA 117 04/07/2018    Lab Results  Component Value Date   WBC 12.0 (H) 04/07/2018   NEUTROABS 8.8 (H) 04/07/2018   HGB 10.9 (L) 04/07/2018   HCT 33.4 (L) 04/07/2018   MCV 84 04/07/2018   PLT 390 04/07/2018     STUDIES: Dg Chest 2 View  Result Date: 04/07/2018 CLINICAL DATA:  43 year old diagnosed with influenza on 03/03/2018, with persistent low-grade fever and mid chest pain and pressure. Current history of lupus. Former smoker. EXAM: CHEST - 2 VIEW COMPARISON:  None. FINDINGS: Cardiac silhouette normal in size. RIGHT paratracheal opacity which is difficult to localize on the LATERAL image. Hilar and mediastinal contours otherwise unremarkable. Vague focal opacity in the RIGHT mid lung on the PA image, again not localized on the LATERAL image. Calcified granuloma in what I believe is the RIGHT LOWER LOBE, best seen on the LATERAL image. Lungs otherwise clear. Bronchovascular markings normal. No pleural effusions. Visualized bony thorax intact. IMPRESSION: 1. Vague focal opacity in the RIGHT mid lung on the PA image, not localized on the LATERAL image, which could represent focal pneumonia. 2. Indeterminate RIGHT paratracheal opacity which could represent a mediastinal mass or lymphadenopathy. Based on the above findings,  CT of the chest (with contrast if the patient has normal renal function) is recommended in further evaluation. These results will be called to the ordering clinician or representative by the Radiologist Assistant, and communication documented in the PACS or zVision Dashboard. Electronically Signed   By: Evangeline Dakin M.D.   On: 04/07/2018 15:45   Ct Chest W Contrast  Result Date: 04/20/2018 CLINICAL DATA:  Low grade fever and mediastinal pressure since mid February, 2020. EXAM: CT CHEST WITH CONTRAST TECHNIQUE: Multidetector CT imaging of the chest was performed during intravenous contrast administration. CONTRAST:  75 mL OMNIPAQUE IOHEXOL 300 MG/ML  SOLN COMPARISON:  PA and lateral chest 04/07/2018. FINDINGS: Cardiovascular: Heart size is normal. No pleural or pericardial effusion. Mediastinum/Nodes: The patient has a right paratracheal lymph node measuring 2.7 cm short axis dimension on image 55. Areas of low attenuation within the node are most consistent with necrosis. Thymic tissue is abnormally prominent. There is a right hilar lymph node measuring 1.2 cm on image 76 which is also low attenuating consistent  necrosis. Calcified subcarinal lymph node is identified. Lungs/Pleura: 3 mass lesions are seen along the pleural surface of the right lung. Lesion on image 33 measures 3.4 x 2.3 cm. A second lesion on image 69 measures 0.9 x 2.0 cm. Third lesion on image 82 measures 3.4 x 1.5 cm. There is some emphysematous change in the lungs. Calcified right lower lobe granulomata are noted. Upper Abdomen: Visualized upper abdomen appears normal. Musculoskeletal: No bony abnormality. IMPRESSION: Three pleural based masses and necrotic mediastinal and right hilar lymphadenopathy consistent with neoplastic process. Findings are nonspecific but could be due to metastatic disease or lymphoma. Recommend PET CT scan for further evaluation. Old granulomatous disease. These results will be called to the ordering clinician  or representative by the Radiologist Assistant, and communication documented in the PACS or zVision Dashboard. Emphysema (ICD10-J43.9). Electronically Signed   By: Inge Rise M.D.   On: 04/20/2018 16:07    ASSESSMENT: Multiple right lung masses.  PLAN:    1. Multiple right lung masses: CT scan results from April 20, 2018 reviewed independently and reported as above.  Case also discussed with Dr. Mortimer Fries in pulmonology.  It was agreed upon that these appear more infectious in nature particularly since patient symptoms improved with antibiotics.  Although malignancy is a possibility, including lymphoma since he is immunocompromised, this is less likely.  Patient was given an additional course of 10 days of Levaquin and will have repeat CT scan in 4 weeks.  He will have follow-up with pulmonology at that point.  If disease persists, would consider biopsy at that time.  No follow-up is necessary in the cancer center, patient can return to clinic if he has a biopsy and there is still suspicion of malignancy. 2.  Glomerulonephritis: Continue CellCept as prescribed.  I spent a total of 45 minutes face-to-face with the patient of which greater than 50% of the visit was spent in counseling and coordination of care as detailed above.   Patient expressed understanding and was in agreement with this plan. He also understands that He can call clinic at any time with any questions, concerns, or complaints.   Cancer Staging No matching staging information was found for the patient.  Lloyd Huger, MD   04/24/2018 12:31 PM

## 2018-04-22 NOTE — Progress Notes (Signed)
  Oncology Nurse Navigator Documentation  Navigator Location: [facility]Med Onc (04/22/18 1400) Referral date to RadOnc/MedOnc: 04/22/18 (04/22/18 1400) )Navigator Encounter Type: Introductory phone call (04/22/18 1400)   Abnormal Finding Date: 04/20/18 (04/22/18 1400)                   Treatment Phase: Abnormal Scans (04/22/18 1400) Barriers/Navigation Needs: Coordination of Care (04/22/18 1400)   Interventions: Coordination of Care (04/22/18 1400)   Coordination of Care: Appts;Radiology (04/22/18 1400)        Acuity: Level 2 (04/22/18 1400)   Acuity Level 2: Initial guidance, education and coordination as needed;Educational needs;Assistance expediting appointments (04/22/18 1400)  phone call made to patient to introduce to navigator services. Upcoming appts reviewed with patient. All questions answered during phone call. Contact info given and instructed to call with any further questions or needs. Pt verbalized understanding.   Time Spent with Patient: 30 (04/22/18 1400)

## 2018-04-23 ENCOUNTER — Other Ambulatory Visit: Payer: Self-pay

## 2018-04-24 ENCOUNTER — Inpatient Hospital Stay: Payer: No Typology Code available for payment source | Attending: Oncology | Admitting: Oncology

## 2018-04-24 ENCOUNTER — Encounter: Payer: Self-pay | Admitting: Internal Medicine

## 2018-04-24 ENCOUNTER — Encounter: Payer: Self-pay | Admitting: Oncology

## 2018-04-24 ENCOUNTER — Ambulatory Visit (INDEPENDENT_AMBULATORY_CARE_PROVIDER_SITE_OTHER): Payer: No Typology Code available for payment source | Admitting: Internal Medicine

## 2018-04-24 ENCOUNTER — Other Ambulatory Visit: Payer: Self-pay

## 2018-04-24 ENCOUNTER — Encounter: Payer: Self-pay | Admitting: *Deleted

## 2018-04-24 DIAGNOSIS — R0781 Pleurodynia: Secondary | ICD-10-CM | POA: Insufficient documentation

## 2018-04-24 DIAGNOSIS — R0602 Shortness of breath: Secondary | ICD-10-CM | POA: Diagnosis not present

## 2018-04-24 DIAGNOSIS — R918 Other nonspecific abnormal finding of lung field: Secondary | ICD-10-CM

## 2018-04-24 DIAGNOSIS — M3214 Glomerular disease in systemic lupus erythematosus: Secondary | ICD-10-CM | POA: Diagnosis not present

## 2018-04-24 DIAGNOSIS — Z792 Long term (current) use of antibiotics: Secondary | ICD-10-CM | POA: Insufficient documentation

## 2018-04-24 DIAGNOSIS — Z87891 Personal history of nicotine dependence: Secondary | ICD-10-CM | POA: Insufficient documentation

## 2018-04-24 DIAGNOSIS — Z79899 Other long term (current) drug therapy: Secondary | ICD-10-CM | POA: Insufficient documentation

## 2018-04-24 DIAGNOSIS — E785 Hyperlipidemia, unspecified: Secondary | ICD-10-CM | POA: Diagnosis not present

## 2018-04-24 DIAGNOSIS — I10 Essential (primary) hypertension: Secondary | ICD-10-CM | POA: Insufficient documentation

## 2018-04-24 DIAGNOSIS — J181 Lobar pneumonia, unspecified organism: Secondary | ICD-10-CM

## 2018-04-24 DIAGNOSIS — R911 Solitary pulmonary nodule: Secondary | ICD-10-CM

## 2018-04-24 MED ORDER — LEVOFLOXACIN 500 MG PO TABS: 500.0000 mg | ORAL_TABLET | Freq: Every day | ORAL | 0 refills | Status: AC

## 2018-04-24 NOTE — Progress Notes (Signed)
Patient here today for initial evaluation regarding pleural mass.

## 2018-04-24 NOTE — Progress Notes (Signed)
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 Patient has been on chronic immunosuppressive therapy with tacrolimus CellCept chronic prednisone therapy and hydroxychloroquine  Several months ago patient developed flulike symptoms with fevers chills night sweats Patient was diagnosed with viral illness and viral bronchitis The wife was tested positive for flu so he had a positive sick contact  Patient symptoms resolved but then returned in 3 weeks with chest tightness chest rattling associated with night sweats and fevers along with a mild productive cough  Patient obtain blood cultures and chest x-ray and  CT chest Patient treated with Levaquin for 7 days 2 days after completion of Levaquin patient fever subsided and the symptoms resolved Patient subsequently developed pleurisy and pleuritic chest pain with some shortness of breath   At this time his symptoms seem to be resolving  I have reviewed the CT chest with the patient and family This pleural-based nodule seem to be related to probable infectious etiology Weight loss 15 pounds in 3 months intentional Patient works as a Teacher, adult education He has smoked 1 pack a day for 20 years  CT chest also shows evidence of emphysema    At this time patient is not toxic appearing No signs of infection Overall is feeling better over the last week       PAST MEDICAL HISTORY :   has a past medical history of Dermatitis, Erectile dysfunction, Glomerulonephritis, Hyperlipidemia, Hypertension, Proteinuria, and Systemic lupus ([place]).  has a past surgical history that includes  No past surgeries and Renal biopsy. Prior to Admission medications   Medication Sig Start Date End Date Taking? Authorizing Provider  atorvastatin (LIPITOR) 20 MG tablet Take 20 mg by mouth daily.    [provider]  benazepril (LOTENSIN) 20 MG tablet Take by mouth. 09/20/10   [provider]  Ferrous Sulfate (IRON) 325 (65 Fe) MG TABS Take 1 tablet (325 mg total) by mouth 2 (two) times daily. 04/10/18   Chrismon, Vickki Muff, PA  hydroxychloroquine (PLAQUENIL) 200 MG tablet Take 400 mg by mouth daily. 12/26/17   [provider]  mycophenolate (CELLCEPT) 500 MG tablet  04/01/18   [provider]  torsemide (DEMADEX) 20 MG tablet Take 20 mg by mouth daily as needed. 03/30/18   [provider]   No Known Allergies  FAMILY HISTORY:  family history includes Allergies in his brother and mother; Clotting disorder in his father. SOCIAL HISTORY:  reports that he has quit smoking. His smoking use included cigarettes. He has a 12.00 pack-year smoking history. He has never used smokeless tobacco. He reports current alcohol use. He reports that he does not use drugs.  REVIEW OF SYSTEMS:   Constitutional: fever-resolving, chills-resolving, weight loss,+ malaise/fatigue-resolving and diaphoresis.  HENT: Negative for hearing loss, ear pain, nosebleeds, congestion, sore throat, neck pain, tinnitus and ear discharge.   Eyes: Negative for blurred vision, double vision, photophobia, pain, discharge and redness.  Respiratory: + Cough,-been hemoptysis,-sputum production, + shortness of breath,-wheezing and-stridor.  Pleuritic chest pain Cardiovascular: Negative for chest pain, palpitations, orthopnea, claudication, leg swelling and PND.  Gastrointestinal: Negative for heartburn, nausea, vomiting, abdominal pain, diarrhea, constipation, blood in stool  and melena.  Genitourinary: Negative for dysuria, urgency, frequency, hematuria and flank pain.  Musculoskeletal: Negative for  myalgias, back pain, joint pain and falls.  Skin: Negative for itching and rash.  Neurological: Negative for dizziness, tingling, tremors, sensory change, speech change, focal weakness, seizures, loss of consciousness, weakness and headaches.  Endo/Heme/Allergies: Negative for environmental allergies and polydipsia. Does not bruise/bleed easily.  ALL OTHER ROS ARE NEGATIVE  BP 120/79  HR 104 RR 18 T 97  o2 sat 99% on RA  Physical Examination:   GENERAL:NAD, no fevers, chills, no weakness no fatigue HEAD: Normocephalic, atraumatic.  EYES: Pupils equal, round, reactive to light. Extraocular muscles intact. No scleral icterus.  MOUTH: Moist mucosal membrane.   EAR, NOSE, THROAT: Clear without exudates. No external lesions.  NECK: Supple. No thyromegaly. No nodules. No JVD.  PULMONARY:CTA B/L no wheezes, + rhonchi CARDIOVASCULAR: S1 and S2. Regular rate and rhythm. No murmurs, rubs, or gallops. No edema.  GASTROINTESTINAL: Soft, nontender, nondistended. No masses. Positive bowel sounds.  MUSCULOSKELETAL: No swelling, clubbing, or edema. Range of motion full in all extremities.  NEUROLOGIC: Cranial nerves II through XII are intact. No gross focal neurological deficits.  SKIN: No ulceration, lesions, rashes, or cyanosis. Skin warm and dry. Turgor intact.  PSYCHIATRIC: Mood, affect within normal limits. The patient is awake, alert and oriented x 3. Insight, judgment intact.      CULTURE RESULTS   Blood cultures negative     IMAGING        I have Independently reviewed images of  CT chest Interpretation:mulitple pleural based nodules with necrotic LAD with calcified nodules likely infectious etiology    ASSESSMENT AND PLAN SYNOPSIS 43 year old pleasant white male seen today for abnormal CT chest with pleural-based nodular opacities in the right lung most likely related to infectious etiology with lobar pneumonia in the setting of chronic immunosuppressive therapy.  At this  time I do not recommend a biopsy at this time I do recommend repeat CT chest in 4 weeks for interval changes and assessment as this most likely is infection in the setting of necrotic lymphadenopathy and calcified lymph nodes in the setting of resolving symptoms of the patient  Recommend follow-up CT chest in 4 weeks Recommend oral antibiotics with Levaquin 500 mg for an additional 10 days No indication for steroids at this time  Patient is not taking his immunosuppressive therapy for his glomerulonephritis at this time Repeat lab tests are pending with nephrology and reassessment of restarting immunosuppressive therapy will be discussed at that time    Patient/Family are satisfied with Plan of action and management. All questions answered Follow-up with me in the office in 4 weeks with CT chest without contrast   Corrin Parker, M.D.  [facility]  Medical Director [place] Director [facility] Cardio-Pulmonary Department

## 2018-04-24 NOTE — Patient Instructions (Addendum)
FOLLOW UP CT CHEST WITHOUT CONTRAST IN 4 WEEKS  START LEVAQUIN 500 mg DAILY FOR 10 days  FOLLOW  UP WITH DR Mortimer Fries IN 4 WEEKS AFTER CT CHEST COMPLETED

## 2018-04-24 NOTE — Progress Notes (Signed)
  Oncology Nurse Navigator Documentation  Navigator Location: [facility] Onc (04/24/18 1200)   )Navigator Encounter Type: Clinic/MDC (04/24/18 1200)                     Patient Visit Type: MedOnc(pulmonary) (04/24/18 1200)   Barriers/Navigation Needs: Coordination of Care (04/24/18 1200)   Interventions: Coordination of Care (04/24/18 1200)   Coordination of Care: Appts;Radiology (04/24/18 1200)       met with patient and his wife during the thoracic multidisciplinary clinic. Pt was seen by Dr. Mortimer Fries and Dr. Grayland Ormond. All questions answered during visit. Reviewed upcoming appts with patient. Contact info given and instructed to call with any further questions or needs. Pt verbalized understanding. Nothing further needed at this time.           Time Spent with Patient: 60 (04/24/18 1200)

## 2018-04-30 ENCOUNTER — Other Ambulatory Visit: Payer: No Typology Code available for payment source

## 2018-05-01 LAB — IRON AND TIBC
IRON SATURATION: 7 % — AB (ref 15–55)
IRON: 15 ug/dL — AB (ref 38–169)
Total Iron Binding Capacity: 203 ug/dL — ABNORMAL LOW (ref 250–450)
UIBC: 188 ug/dL (ref 111–343)

## 2018-05-01 LAB — SPECIMEN STATUS REPORT

## 2018-05-26 ENCOUNTER — Ambulatory Visit
Admission: RE | Admit: 2018-05-26 | Discharge: 2018-05-26 | Disposition: A | Discharge status: Home / Self Care | Payer: No Typology Code available for payment source | Source: Ambulatory Visit | Attending: Internal Medicine | Admitting: Internal Medicine

## 2018-05-26 ENCOUNTER — Other Ambulatory Visit: Payer: Self-pay

## 2018-05-26 DIAGNOSIS — R911 Solitary pulmonary nodule: Secondary | ICD-10-CM | POA: Diagnosis present

## 2018-05-26 DIAGNOSIS — R918 Other nonspecific abnormal finding of lung field: Secondary | ICD-10-CM | POA: Insufficient documentation

## 2018-05-26 DIAGNOSIS — J181 Lobar pneumonia, unspecified organism: Secondary | ICD-10-CM | POA: Diagnosis present

## 2018-06-04 ENCOUNTER — Encounter: Payer: Self-pay | Admitting: Internal Medicine

## 2018-06-04 ENCOUNTER — Ambulatory Visit (INDEPENDENT_AMBULATORY_CARE_PROVIDER_SITE_OTHER): Payer: No Typology Code available for payment source | Admitting: Internal Medicine

## 2018-06-04 ENCOUNTER — Encounter: Payer: Self-pay | Admitting: Family Medicine

## 2018-06-04 DIAGNOSIS — J181 Lobar pneumonia, unspecified organism: Secondary | ICD-10-CM

## 2018-06-04 DIAGNOSIS — E049 Nontoxic goiter, unspecified: Secondary | ICD-10-CM | POA: Diagnosis not present

## 2018-06-04 NOTE — Addendum Note (Signed)
Addended by: Darreld Mclean on: 06/04/2018 10:31 AM   Modules accepted: Orders

## 2018-06-04 NOTE — Patient Instructions (Addendum)
No further testing or imaging at this time  Follow up with Nephrology as scheduled to discuss further therapy.  ENT referral for thyroid goiter

## 2018-06-04 NOTE — Progress Notes (Signed)
Name: Timothy Best MRN: [id number] DOB: April 18, 1975        VIDEO/TELEPHONE VISIT    In the setting of the current Covid19 crisis, you are scheduled for a  visit with me on 06/04/2018  Just as we do with many in-office visits, in order for you to participate in this visit, we must obtain consent.   I can obtain your verbal consent now.  PATIENT AGREES AND CONFIRMS -YES This Visit has Audio and Visual Capabilities for optimal patient care experience   Evaluation Performed:  Follow-up visit this visit type was conducted due to national recommendations for restrictions regarding the COVID-19 Pandemic (e.g. social distancing).  This format is felt to be most appropriate for this patient at this time.  All issues noted in this document were discussed and addressed.     Virtual Visit via Telephone Note   I connected with patient on 06/04/2018  by telephone and verified that I am speaking with the correct person using two identifiers.   I discussed the limitations, risks, security and privacy concerns of performing an evaluation and management service by telephone and the availability of in person appointments. I also discussed with the patient that there may be a patient responsible charge related to this service. The patient expressed understanding and agreed to proceed.   Location of the patient: Home Location of provider: Home Participating persons: Patient and provider only  CONSULTATION DATE: 06/04/2018  REFERRING MD : [place]:  Follow up Abnormal CT chest    HISTORY OF PRESENT ILLNESS: CT chest reviewed 05/26/18 nodular opacities have significantly decreased in size This highly suggests infectious etiology  No evidence of infection No SOB No fevers  NO DOE and chest pain    PAST MEDICAL HISTORY :   has a past medical history of Dermatitis, Erectile dysfunction, Glomerulonephritis, Hyperlipidemia, Hypertension, Proteinuria, and Systemic lupus  ([place]).  has a past surgical history that includes No past surgeries and Renal biopsy. Prior to Admission medications   Medication Sig Start Date End Date Taking? Authorizing Provider  atorvastatin (LIPITOR) 20 MG tablet Take 20 mg by mouth daily.    [provider]  benazepril (LOTENSIN) 20 MG tablet Take by mouth. 09/20/10   [provider]  Ferrous Sulfate (IRON) 325 (65 Fe) MG TABS Take 1 tablet (325 mg total) by mouth 2 (two) times daily. 04/10/18   Chrismon, Vickki Muff, PA  hydroxychloroquine (PLAQUENIL) 200 MG tablet Take 400 mg by mouth daily. 12/26/17   [provider]  mycophenolate (CELLCEPT) 500 MG tablet  04/01/18   [provider]  torsemide (DEMADEX) 20 MG tablet Take 20 mg by mouth daily as needed. 03/30/18   [provider]   No Known Allergies      Review of Systems:  Gen:  Denies  fever, sweats, chills weigh loss  HEENT: Denies blurred vision, double vision, ear pain, eye pain, hearing loss, nose bleeds, sore throat Cardiac:  No dizziness, chest pain or heaviness, chest tightness,edema, No JVD Resp:   No cough, -sputum production, -shortness of breath,-wheezing, -hemoptysis,  Gi: Denies swallowing difficulty, stomach pain, nausea or vomiting, diarrhea, constipation, bowel incontinence Gu:  Denies bladder incontinence, burning urine Ext:   Denies Joint pain, stiffness or swelling Skin: Denies  skin rash, easy bruising or bleeding or hives Endoc:  Denies polyuria, polydipsia , polyphagia or weight change Psych:   Denies depression, insomnia or hallucinations  Other:  All other systems negative  ASSESSMENT AND PLAN SYNOPSIS  abnormal CT chest with pleural-based nodular opacities in the right lung most likely related to infectious etiology with lobar pneumonia in the setting of chronic immunosuppressive therapy  REPEAT CT CHEST REVEALS NEAR COMPLETE RESOLUTION OF NODULAR PLURAL BASED OPACITIES   Patient is not taking his  immunosuppressive therapy for his glomerulonephritis at this time Repeat lab tests are pending with nephrology and reassessment of restarting immunosuppressive therapy will be discussed at that time   [place]  COVID-19 Education: The signs and symptoms of COVID-19 were discussed with the patient and how to seek care for testing (follow up with PCP or arrange E-visit).  The importance of social distancing was discussed today.  TOTAL TIME SPENT 24 mins   Patient satisfied with Plan of action and management. All questions answered  Follow up as needed  Corrin Parker, M.D.  [facility]  Medical Director [place] Director [facility] Cardio-Pulmonary Department

## 2018-06-16 ENCOUNTER — Telehealth: Payer: Self-pay

## 2018-06-16 NOTE — Telephone Encounter (Signed)
Received records request from [facility]. Faxed to [employer] with confirmation.

## 2018-06-19 ENCOUNTER — Other Ambulatory Visit: Payer: Self-pay | Admitting: Otolaryngology

## 2018-06-19 DIAGNOSIS — E041 Nontoxic single thyroid nodule: Secondary | ICD-10-CM

## 2018-06-20 ENCOUNTER — Other Ambulatory Visit: Payer: Self-pay | Admitting: Family Medicine

## 2018-06-20 DIAGNOSIS — G2581 Restless legs syndrome: Secondary | ICD-10-CM

## 2018-07-01 ENCOUNTER — Ambulatory Visit: Payer: No Typology Code available for payment source

## 2018-09-22 ENCOUNTER — Encounter: Payer: Self-pay | Admitting: Family Medicine

## 2018-09-28 ENCOUNTER — Other Ambulatory Visit: Payer: Self-pay | Admitting: Family Medicine

## 2018-09-28 DIAGNOSIS — R6882 Decreased libido: Secondary | ICD-10-CM

## 2018-09-30 ENCOUNTER — Encounter: Payer: Self-pay | Admitting: Family Medicine

## 2018-10-02 ENCOUNTER — Other Ambulatory Visit: Payer: Self-pay

## 2018-10-02 DIAGNOSIS — E041 Nontoxic single thyroid nodule: Secondary | ICD-10-CM

## 2018-10-02 DIAGNOSIS — R6882 Decreased libido: Secondary | ICD-10-CM

## 2018-10-04 LAB — TESTOSTERONE,FREE AND TOTAL
Testosterone, Free: 6.1 pg/mL — ABNORMAL LOW (ref 6.8–21.5)
Testosterone: 563 ng/dL (ref 264–916)

## 2018-10-05 ENCOUNTER — Telehealth: Payer: Self-pay

## 2018-10-05 NOTE — Telephone Encounter (Signed)
-----   Message from Margo Common, [place] sent at 10/04/2018 10:51 PM EDT ----- Total testosterone in the middle of normal range at 563. Free testosterone level shows some mild variation but not indicative of need for hormone supplementation. May use DHEA 50 mg qd (OTC) to help stabilize free testosterone and help improve libido. Should recheck levels in 3 months to assess progress.

## 2018-10-05 NOTE — Telephone Encounter (Signed)
Patient has viewed results on mychart. °

## 2018-10-19 ENCOUNTER — Ambulatory Visit
Admission: RE | Admit: 2018-10-19 | Discharge: 2018-10-19 | Disposition: A | Discharge status: Home / Self Care | Payer: No Typology Code available for payment source | Source: Ambulatory Visit | Attending: Otolaryngology | Admitting: Otolaryngology

## 2018-10-19 ENCOUNTER — Other Ambulatory Visit: Payer: Self-pay

## 2018-10-19 DIAGNOSIS — E041 Nontoxic single thyroid nodule: Secondary | ICD-10-CM | POA: Diagnosis present

## 2018-10-21 ENCOUNTER — Other Ambulatory Visit: Payer: Self-pay | Admitting: Otolaryngology

## 2018-10-21 DIAGNOSIS — E041 Nontoxic single thyroid nodule: Secondary | ICD-10-CM

## 2018-10-27 ENCOUNTER — Ambulatory Visit: Payer: No Typology Code available for payment source

## 2018-11-05 NOTE — Telephone Encounter (Signed)
DK please advise. Thanks 

## 2018-11-10 ENCOUNTER — Other Ambulatory Visit: Payer: Self-pay | Admitting: Nephrology

## 2018-11-10 DIAGNOSIS — J984 Other disorders of lung: Secondary | ICD-10-CM

## 2018-11-11 ENCOUNTER — Ambulatory Visit
Admission: RE | Admit: 2018-11-11 | Discharge: 2018-11-11 | Disposition: A | Discharge status: Home / Self Care | Payer: No Typology Code available for payment source | Source: Ambulatory Visit | Attending: Otolaryngology | Admitting: Otolaryngology

## 2018-11-11 ENCOUNTER — Other Ambulatory Visit: Payer: Self-pay

## 2018-11-11 DIAGNOSIS — E041 Nontoxic single thyroid nodule: Secondary | ICD-10-CM | POA: Diagnosis not present

## 2018-11-11 NOTE — Procedures (Signed)
Interventional Radiology Procedure Note  Procedure: Left sided thyroid nodule biopsy  Complications: None immediate  Estimated Blood Loss: < 10 mL  Recommendations: Followup with path  Signed,  Constance Holster, MD

## 2018-11-13 LAB — PATHOLOGY

## 2018-11-18 ENCOUNTER — Other Ambulatory Visit: Payer: Self-pay

## 2018-11-18 ENCOUNTER — Ambulatory Visit
Admission: RE | Admit: 2018-11-18 | Discharge: 2018-11-18 | Disposition: A | Discharge status: Home / Self Care | Payer: No Typology Code available for payment source | Source: Ambulatory Visit | Attending: Nephrology | Admitting: Nephrology

## 2018-11-18 DIAGNOSIS — J984 Other disorders of lung: Secondary | ICD-10-CM | POA: Diagnosis present

## 2018-11-27 ENCOUNTER — Encounter: Payer: Self-pay | Admitting: Otolaryngology

## 2018-11-27 LAB — CYTOLOGY - NON PAP

## 2018-12-01 ENCOUNTER — Other Ambulatory Visit: Payer: Self-pay | Admitting: Nephrology

## 2018-12-01 DIAGNOSIS — N022 Recurrent and persistent hematuria with diffuse membranous glomerulonephritis: Secondary | ICD-10-CM

## 2018-12-02 ENCOUNTER — Telehealth: Payer: Self-pay

## 2018-12-02 NOTE — Telephone Encounter (Signed)
Spoke with Dr. Candiss Norse nurse she is going to have them fax over Rx for patient to get Rituximab

## 2018-12-16 ENCOUNTER — Other Ambulatory Visit: Payer: Self-pay

## 2018-12-16 ENCOUNTER — Other Ambulatory Visit
Admission: RE | Admit: 2018-12-16 | Discharge: 2018-12-16 | Disposition: A | Discharge status: Home / Self Care | Payer: No Typology Code available for payment source | Source: Ambulatory Visit | Attending: Otolaryngology | Admitting: Otolaryngology

## 2018-12-16 DIAGNOSIS — Z01818 Encounter for other preprocedural examination: Secondary | ICD-10-CM | POA: Insufficient documentation

## 2018-12-16 DIAGNOSIS — D440 Neoplasm of uncertain behavior of thyroid gland: Secondary | ICD-10-CM | POA: Insufficient documentation

## 2018-12-16 DIAGNOSIS — I10 Essential (primary) hypertension: Secondary | ICD-10-CM | POA: Diagnosis not present

## 2018-12-16 HISTORY — DX: Pneumonia, unspecified organism: J18.9

## 2018-12-16 NOTE — Patient Instructions (Addendum)
Your procedure is scheduled on: Tuesday, December 22, 2018 Report to Day Surgery on the 2nd floor of the [facility]. To find out your arrival time, please call [phone] between 1PM - 3PM on: Monday, December 21, 2018  REMEMBER: Instructions that are not followed completely may result in serious medical risk, up to and including death; or upon the discretion of your surgeon and anesthesiologist your surgery may need to be rescheduled.  Do not eat food after midnight the night before surgery.  No gum chewing, lozengers or hard candies.  You may however, drink CLEAR liquids up to 2 hours before you are scheduled to arrive for your surgery. Do not drink anything within 2 hours of the start of your surgery.  Clear liquids include: - water  - apple juice without pulp - gatorade - black coffee or tea (Do NOT add milk or creamers to the coffee or tea) Do NOT drink anything that is not on this list.  No Alcohol for 24 hours before or after surgery.  No Smoking including e-cigarettes for 24 hours prior to surgery.  No chewable tobacco products for at least 6 hours prior to surgery.  No nicotine patches on the day of surgery.  On the morning of surgery brush your teeth with toothpaste and water, you may rinse your mouth with mouthwash if you wish. Do not swallow any toothpaste or mouthwash.  Notify your doctor if there is any change in your medical condition (cold, fever, infection).  Do not wear jewelry, make-up, hairpins, clips or nail polish.  Do not wear lotions, powders, or perfumes.   Do not shave 48 hours prior to surgery.   Contacts and dentures may not be worn into surgery.  Do not bring valuables to the hospital, including drivers license, insurance or credit cards.   is not responsible for any belongings or valuables.   DO NOT TAKE ANY MEDICATIONS THE MORNING OF SURGERY  Use CHG Soap as directed on instruction sheet.  Now!  Stop Anti-inflammatories  (NSAIDS) such as Advil, Aleve, Ibuprofen, Motrin, Naproxen, Naprosyn and Aspirin based products such as Excedrin, Goodys Powder, BC Powder. (May take Tylenol or Acetaminophen if needed.)  Now!  Stop ANY OVER THE COUNTER supplements until after surgery. (May continue multivitamin.)  If you are being admitted to the hospital overnight, leave your suitcase in the car. After surgery it may be brought to your room.  If you are being discharged the day of surgery, you will not be allowed to drive home. You will need a responsible adult to drive you home and stay with you that night.   If you are taking public transportation, you will need to have a responsible adult with you. Please confirm with your physician that it is acceptable to use public transportation.   Please call [phone] if you have any questions about these instructions.

## 2018-12-17 ENCOUNTER — Other Ambulatory Visit: Payer: Self-pay | Admitting: Family Medicine

## 2018-12-17 DIAGNOSIS — G2581 Restless legs syndrome: Secondary | ICD-10-CM

## 2018-12-18 ENCOUNTER — Other Ambulatory Visit: Payer: Self-pay

## 2018-12-18 ENCOUNTER — Inpatient Hospital Stay: Admission: RE | Admit: 2018-12-18 | Payer: No Typology Code available for payment source | Source: Ambulatory Visit

## 2018-12-18 ENCOUNTER — Encounter
Admission: RE | Admit: 2018-12-18 | Discharge: 2018-12-18 | Disposition: A | Discharge status: Home / Self Care | Payer: No Typology Code available for payment source | Source: Ambulatory Visit | Attending: Otolaryngology | Admitting: Otolaryngology

## 2018-12-18 DIAGNOSIS — Z01818 Encounter for other preprocedural examination: Secondary | ICD-10-CM | POA: Insufficient documentation

## 2018-12-18 DIAGNOSIS — D440 Neoplasm of uncertain behavior of thyroid gland: Secondary | ICD-10-CM | POA: Insufficient documentation

## 2018-12-18 DIAGNOSIS — I10 Essential (primary) hypertension: Secondary | ICD-10-CM | POA: Insufficient documentation

## 2018-12-18 DIAGNOSIS — Z20828 Contact with and (suspected) exposure to other viral communicable diseases: Secondary | ICD-10-CM | POA: Diagnosis not present

## 2018-12-18 LAB — CBC
HCT: 43.6 % (ref 39.0–52.0)
Hemoglobin: 14.9 g/dL (ref 13.0–17.0)
MCH: 28.9 pg (ref 26.0–34.0)
MCHC: 34.2 g/dL (ref 30.0–36.0)
MCV: 84.7 fL (ref 80.0–100.0)
Platelets: 245 10*3/uL (ref 150–400)
RBC: 5.15 MIL/uL (ref 4.22–5.81)
RDW: 12.7 % (ref 11.5–15.5)
WBC: 5.8 10*3/uL (ref 4.0–10.5)
nRBC: 0 % (ref 0.0–0.2)

## 2018-12-18 LAB — BASIC METABOLIC PANEL
Anion gap: 9 (ref 5–15)
BUN: 20 mg/dL (ref 6–20)
CO2: 26 mmol/L (ref 22–32)
Calcium: 8.4 mg/dL — ABNORMAL LOW (ref 8.9–10.3)
Chloride: 103 mmol/L (ref 98–111)
Creatinine, Ser: 0.97 mg/dL (ref 0.61–1.24)
GFR calc Af Amer: >60 mL/min (ref >60)
GFR calc non Af Amer: >60 mL/min (ref >60)
Glucose, Bld: 99 mg/dL (ref 70–99)
Potassium: 3.8 mmol/L (ref 3.5–5.1)
Sodium: 138 mmol/L (ref 135–145)

## 2018-12-18 LAB — SARS CORONAVIRUS 2 (TAT 6-24 HRS): SARS Coronavirus 2: NEGATIVE

## 2018-12-22 ENCOUNTER — Other Ambulatory Visit: Payer: Self-pay

## 2018-12-22 ENCOUNTER — Ambulatory Visit: Payer: No Typology Code available for payment source | Admitting: Anesthesiology

## 2018-12-22 ENCOUNTER — Encounter: Payer: Self-pay | Admitting: *Deleted

## 2018-12-22 ENCOUNTER — Ambulatory Visit
Admission: RE | Admit: 2018-12-22 | Discharge: 2018-12-22 | Disposition: A | Discharge status: Home / Self Care | Payer: No Typology Code available for payment source | Attending: Otolaryngology | Admitting: Otolaryngology

## 2018-12-22 ENCOUNTER — Encounter
Admission: RE | Disposition: A | Discharge status: Home / Self Care | Payer: Self-pay | Source: Home / Self Care | Attending: Otolaryngology

## 2018-12-22 DIAGNOSIS — Z79899 Other long term (current) drug therapy: Secondary | ICD-10-CM | POA: Diagnosis not present

## 2018-12-22 DIAGNOSIS — Z7951 Long term (current) use of inhaled steroids: Secondary | ICD-10-CM | POA: Diagnosis not present

## 2018-12-22 DIAGNOSIS — I10 Essential (primary) hypertension: Secondary | ICD-10-CM | POA: Diagnosis not present

## 2018-12-22 DIAGNOSIS — M3214 Glomerular disease in systemic lupus erythematosus: Secondary | ICD-10-CM | POA: Diagnosis not present

## 2018-12-22 DIAGNOSIS — D440 Neoplasm of uncertain behavior of thyroid gland: Secondary | ICD-10-CM | POA: Diagnosis present

## 2018-12-22 DIAGNOSIS — Z87891 Personal history of nicotine dependence: Secondary | ICD-10-CM | POA: Diagnosis not present

## 2018-12-22 DIAGNOSIS — E7800 Pure hypercholesterolemia, unspecified: Secondary | ICD-10-CM | POA: Diagnosis not present

## 2018-12-22 HISTORY — PX: THYROIDECTOMY: SHX17

## 2018-12-22 SURGERY — THYROIDECTOMY
Anesthesia: General | Laterality: Left

## 2018-12-22 MED ORDER — MEPERIDINE HCL 50 MG/ML IJ SOLN: 6.2500 mg | INTRAMUSCULAR | Status: DC | PRN

## 2018-12-22 MED ORDER — LIDOCAINE HCL (PF) 2 % IJ SOLN
INTRAMUSCULAR | Status: AC
  Filled 2018-12-22: qty 10

## 2018-12-22 MED ORDER — SUCCINYLCHOLINE CHLORIDE 20 MG/ML IJ SOLN
INTRAMUSCULAR | Status: AC
  Filled 2018-12-22: qty 1

## 2018-12-22 MED ORDER — FENTANYL CITRATE (PF) 100 MCG/2ML IJ SOLN
INTRAMUSCULAR | Status: AC
  Filled 2018-12-22: qty 2

## 2018-12-22 MED ORDER — BUPIVACAINE HCL (PF) 0.25 % IJ SOLN
INTRAMUSCULAR | Status: AC
  Filled 2018-12-22: qty 30

## 2018-12-22 MED ORDER — EPINEPHRINE PF 1 MG/ML IJ SOLN
INTRAMUSCULAR | Status: AC
  Filled 2018-12-22: qty 1

## 2018-12-22 MED ORDER — MIDAZOLAM HCL 2 MG/2ML IJ SOLN
INTRAMUSCULAR | Status: AC
  Filled 2018-12-22: qty 2

## 2018-12-22 MED ORDER — FAMOTIDINE 20 MG PO TABS
20.0000 mg | ORAL_TABLET | Freq: Once | ORAL | Status: AC
  Administered 2018-12-22: 06:00:00 20 mg via ORAL

## 2018-12-22 MED ORDER — LIDOCAINE HCL (CARDIAC) PF 100 MG/5ML IV SOSY
PREFILLED_SYRINGE | INTRAVENOUS | Status: DC | PRN
  Administered 2018-12-22: 100 mg via INTRAVENOUS

## 2018-12-22 MED ORDER — MIDAZOLAM HCL 2 MG/2ML IJ SOLN
INTRAMUSCULAR | Status: DC | PRN
  Administered 2018-12-22: 2 mg via INTRAVENOUS

## 2018-12-22 MED ORDER — OXYCODONE HCL 5 MG/5ML PO SOLN: 5.0000 mg | Freq: Once | ORAL | Status: DC | PRN

## 2018-12-22 MED ORDER — ONDANSETRON HCL 4 MG/2ML IJ SOLN
INTRAMUSCULAR | Status: AC
  Filled 2018-12-22: qty 2

## 2018-12-22 MED ORDER — BUPIVACAINE-EPINEPHRINE (PF) 0.25% -1:200000 IJ SOLN
INTRAMUSCULAR | Status: DC | PRN
  Administered 2018-12-22: 7 mL via PERINEURAL

## 2018-12-22 MED ORDER — LACTATED RINGERS IV SOLN
INTRAVENOUS | Status: DC
  Administered 2018-12-22 (×2): via INTRAVENOUS

## 2018-12-22 MED ORDER — PROPOFOL 10 MG/ML IV BOLUS
INTRAVENOUS | Status: DC | PRN
  Administered 2018-12-22: 200 mg via INTRAVENOUS

## 2018-12-22 MED ORDER — PROPOFOL 500 MG/50ML IV EMUL
INTRAVENOUS | Status: AC
  Filled 2018-12-22: qty 50

## 2018-12-22 MED ORDER — OXYCODONE-ACETAMINOPHEN 5-325 MG PO TABS: 1.0000 | ORAL_TABLET | Freq: Four times a day (QID) | ORAL | 0 refills | Status: AC | PRN

## 2018-12-22 MED ORDER — ONDANSETRON HCL 4 MG PO TABS: 4.0000 mg | ORAL_TABLET | Freq: Three times a day (TID) | ORAL | 0 refills | Status: DC | PRN

## 2018-12-22 MED ORDER — SODIUM CHLORIDE 0.9 % IV SOLN
INTRAVENOUS | Status: DC | PRN
  Administered 2018-12-22: .1 ug/kg/min via INTRAVENOUS

## 2018-12-22 MED ORDER — ROCURONIUM BROMIDE 100 MG/10ML IV SOLN
INTRAVENOUS | Status: DC | PRN
  Administered 2018-12-22: 5 mg via INTRAVENOUS

## 2018-12-22 MED ORDER — SUGAMMADEX SODIUM 200 MG/2ML IV SOLN
INTRAVENOUS | Status: DC | PRN
  Administered 2018-12-22: 200 mg via INTRAVENOUS

## 2018-12-22 MED ORDER — ROCURONIUM BROMIDE 50 MG/5ML IV SOLN
INTRAVENOUS | Status: AC
  Filled 2018-12-22: qty 1

## 2018-12-22 MED ORDER — BACITRACIN ZINC 500 UNIT/GM EX OINT
TOPICAL_OINTMENT | CUTANEOUS | Status: AC
  Filled 2018-12-22: qty 28.35

## 2018-12-22 MED ORDER — REMIFENTANIL HCL 1 MG IV SOLR
INTRAVENOUS | Status: AC
  Filled 2018-12-22: qty 1000

## 2018-12-22 MED ORDER — ONDANSETRON HCL 4 MG/2ML IJ SOLN
INTRAMUSCULAR | Status: DC | PRN
  Administered 2018-12-22: 4 mg via INTRAVENOUS

## 2018-12-22 MED ORDER — PROPOFOL 10 MG/ML IV BOLUS
INTRAVENOUS | Status: AC
  Filled 2018-12-22: qty 20

## 2018-12-22 MED ORDER — EPHEDRINE SULFATE 50 MG/ML IJ SOLN
INTRAMUSCULAR | Status: DC | PRN
  Administered 2018-12-22: 10 mg via INTRAVENOUS

## 2018-12-22 MED ORDER — OXYCODONE-ACETAMINOPHEN 5-325 MG PO TABS
ORAL_TABLET | ORAL | Status: AC
  Filled 2018-12-22: qty 1

## 2018-12-22 MED ORDER — PROMETHAZINE HCL 25 MG/ML IJ SOLN: 6.2500 mg | INTRAMUSCULAR | Status: DC | PRN

## 2018-12-22 MED ORDER — FAMOTIDINE 20 MG PO TABS
ORAL_TABLET | ORAL | Status: AC
  Administered 2018-12-22: 20 mg via ORAL
  Filled 2018-12-22: qty 1

## 2018-12-22 MED ORDER — OXYCODONE-ACETAMINOPHEN 5-325 MG PO TABS
1.0000 | ORAL_TABLET | Freq: Four times a day (QID) | ORAL | Status: DC | PRN
  Administered 2018-12-22: 1 via ORAL

## 2018-12-22 MED ORDER — FENTANYL CITRATE (PF) 100 MCG/2ML IJ SOLN
25.0000 ug | INTRAMUSCULAR | Status: DC | PRN
  Administered 2018-12-22 (×2): 50 ug via INTRAVENOUS

## 2018-12-22 MED ORDER — FENTANYL CITRATE (PF) 100 MCG/2ML IJ SOLN
INTRAMUSCULAR | Status: DC | PRN
  Administered 2018-12-22 (×2): 50 ug via INTRAVENOUS

## 2018-12-22 MED ORDER — PHENYLEPHRINE HCL (PRESSORS) 10 MG/ML IV SOLN
INTRAVENOUS | Status: DC | PRN
  Administered 2018-12-22: 80 ug via INTRAVENOUS
  Administered 2018-12-22 (×2): 100 ug via INTRAVENOUS
  Administered 2018-12-22: 80 ug via INTRAVENOUS
  Administered 2018-12-22 (×5): 100 ug via INTRAVENOUS

## 2018-12-22 MED ORDER — SEVOFLURANE IN SOLN
RESPIRATORY_TRACT | Status: AC
  Filled 2018-12-22: qty 250

## 2018-12-22 MED ORDER — SUCCINYLCHOLINE CHLORIDE 20 MG/ML IJ SOLN
INTRAMUSCULAR | Status: DC | PRN
  Administered 2018-12-22: 100 mg via INTRAVENOUS

## 2018-12-22 MED ORDER — PROPOFOL 500 MG/50ML IV EMUL
INTRAVENOUS | Status: DC | PRN
  Administered 2018-12-22: 75 ug/kg/min via INTRAVENOUS

## 2018-12-22 MED ORDER — OXYCODONE HCL 5 MG PO TABS: 5.0000 mg | ORAL_TABLET | Freq: Once | ORAL | Status: DC | PRN

## 2018-12-22 SURGICAL SUPPLY — 39 items
BLADE SURG 15 STRL LF DISP TIS (BLADE) ×1 IMPLANT
BLADE SURG 15 STRL SS (BLADE) ×2
CANISTER SUCT 1200ML W/VALVE (MISCELLANEOUS) ×3 IMPLANT
CLOSURE WOUND 1/4X4 (GAUZE/BANDAGES/DRESSINGS)
CORD BIP STRL DISP 12FT (MISCELLANEOUS) ×3 IMPLANT
COVER WAND RF STERILE (DRAPES) ×3 IMPLANT
DERMABOND ADVANCED (GAUZE/BANDAGES/DRESSINGS) ×2
DERMABOND ADVANCED .7 DNX12 (GAUZE/BANDAGES/DRESSINGS) ×1 IMPLANT
DRAIN TLS ROUND 10FR (DRAIN) IMPLANT
DRAPE MAG INST 16X20 L/F (DRAPES) ×3 IMPLANT
DRSG TEGADERM 2-3/8X2-3/4 SM (GAUZE/BANDAGES/DRESSINGS) IMPLANT
ELECT LARYNGEAL 6/7 (MISCELLANEOUS)
ELECT LARYNGEAL 8/9 (MISCELLANEOUS) ×3
ELECT REM PT RETURN 9FT ADLT (ELECTROSURGICAL) ×3
ELECTRODE LARYNGEAL 6/7 (MISCELLANEOUS) IMPLANT
ELECTRODE LARYNGEAL 8/9 (MISCELLANEOUS) ×1 IMPLANT
ELECTRODE REM PT RTRN 9FT ADLT (ELECTROSURGICAL) ×1 IMPLANT
FORCEPS JEWEL BIP 4-3/4 STR (INSTRUMENTS) ×3 IMPLANT
GAUZE 4X4 16PLY RFD (DISPOSABLE) IMPLANT
GLOVE BIO SURGEON STRL SZ7.5 (GLOVE) ×6 IMPLANT
GOWN STRL REUS W/ TWL LRG LVL3 (GOWN DISPOSABLE) ×3 IMPLANT
GOWN STRL REUS W/TWL LRG LVL3 (GOWN DISPOSABLE) ×6
HEMOSTAT SURGICEL 2X3 (HEMOSTASIS) ×3 IMPLANT
HOOK STAY BLUNT/RETRACTOR 5M (MISCELLANEOUS) IMPLANT
KIT TURNOVER KIT A (KITS) ×3 IMPLANT
LABEL OR SOLS (LABEL) ×3 IMPLANT
NS IRRIG 500ML POUR BTL (IV SOLUTION) ×3 IMPLANT
PACK HEAD/NECK (MISCELLANEOUS) ×3 IMPLANT
PROBE NEUROSIGN BIPOL (MISCELLANEOUS) ×1 IMPLANT
PROBE NEUROSIGN BIPOLAR (MISCELLANEOUS) ×2
SHEARS HARMONIC 9CM CVD (BLADE) ×3 IMPLANT
SPONGE KITTNER 5P (MISCELLANEOUS) ×3 IMPLANT
STRIP CLOSURE SKIN 1/4X4 (GAUZE/BANDAGES/DRESSINGS) IMPLANT
SUT PROLENE 6 0 P 1 18 (SUTURE) ×3 IMPLANT
SUT SILK 2 0 (SUTURE) ×2
SUT SILK 2 0 SH (SUTURE) ×3 IMPLANT
SUT SILK 2-0 18XBRD TIE 12 (SUTURE) ×1 IMPLANT
SUT VIC AB 4-0 RB1 18 (SUTURE) ×3 IMPLANT
SYSTEM CHEST DRAIN TLS 7FR (DRAIN) IMPLANT

## 2018-12-22 NOTE — Discharge Instructions (Signed)

## 2018-12-22 NOTE — Anesthesia Preprocedure Evaluation (Signed)
Anesthesia Evaluation  Patient identified by MRN, date of birth, ID band Patient awake    Reviewed: Allergy & Precautions, NPO status , Patient's Chart, lab work & pertinent test results  History of Anesthesia Complications Negative for: history of anesthetic complications  Airway Mallampati: II  TM Distance: >3 FB Neck ROM: Full    Dental no notable dental hx.    Pulmonary neg sleep apnea, neg COPD, former smoker,    breath sounds clear to auscultation- rhonchi (-) wheezing      Cardiovascular hypertension, Pt. on medications (-) CAD, (-) Past MI, (-) Cardiac Stents and (-) CABG  Rhythm:Regular Rate:Normal - Systolic murmurs and - Diastolic murmurs    Neuro/Psych neg Seizures negative neurological ROS  negative psych ROS   GI/Hepatic negative GI ROS, Neg liver ROS,   Endo/Other  neg diabetes  Renal/GU Renal disease (lupus nephritis)     Musculoskeletal negative musculoskeletal ROS (+)   Abdominal (+) - obese,   Peds  Hematology negative hematology ROS (+)   Anesthesia Other Findings Past Medical History: No date: Dermatitis No date: Erectile dysfunction No date: Glomerulonephritis No date: Hyperlipidemia No date: Hypertension No date: Pneumonia No date: Proteinuria No date: Systemic lupus (HCC)   Reproductive/Obstetrics                             Anesthesia Physical Anesthesia Plan  ASA: II  Anesthesia Plan: General   Post-op Pain Management:    Induction: Intravenous  PONV Risk Score and Plan: 1 and Ondansetron, Dexamethasone and Midazolam  Airway Management Planned: Oral ETT  Additional Equipment:   Intra-op Plan:   Post-operative Plan: Extubation in OR  Informed Consent: I have reviewed the patients History and Physical, chart, labs and discussed the procedure including the risks, benefits and alternatives for the proposed anesthesia with the patient or  authorized representative who has indicated his/her understanding and acceptance.     Dental advisory given  Plan Discussed with: CRNA and Anesthesiologist  Anesthesia Plan Comments:         Anesthesia Quick Evaluation

## 2018-12-22 NOTE — OR Nursing (Signed)
Discharge pending ride home.  

## 2018-12-22 NOTE — Anesthesia Post-op Follow-up Note (Signed)
Anesthesia QCDR form completed.        

## 2018-12-22 NOTE — Progress Notes (Signed)
Ice chips given to patient for sore throat.

## 2018-12-22 NOTE — H&P (Signed)
..  History and Physical paper copy reviewed and updated date of procedure and will be scanned into system.  Patient seen and examined and marked.  

## 2018-12-22 NOTE — Anesthesia Procedure Notes (Signed)
Procedure Name: Intubation Date/Time: 12/22/2018 7:40 AM Performed by: Allean Found, CRNA Pre-anesthesia Checklist: Patient identified, Patient being monitored, Timeout performed, Emergency Drugs available and Suction available Patient Re-evaluated:Patient Re-evaluated prior to induction Oxygen Delivery Method: Circle system utilized Preoxygenation: Pre-oxygenation with 100% oxygen Induction Type: IV induction Ventilation: Mask ventilation without difficulty Laryngoscope Size: 3 and McGraph Grade View: Grade I Tube type: Oral Tube size: 7.5 mm Number of attempts: 1 Airway Equipment and Method: Stylet Placement Confirmation: ETT inserted through vocal cords under direct vision,  positive ETCO2 and breath sounds checked- equal and bilateral Secured at: 21 cm Tube secured with: Tape Dental Injury: Teeth and Oropharynx as per pre-operative assessment  Comments: Endotracheal tube wrapped in Neurosign V4

## 2018-12-22 NOTE — Op Note (Signed)
..  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  Small inferior left thyroid nodule removed with hemithryoidectomy.  Description of Procedure: After the patient was identified in hold and the history and physical and consent was reviewed and updated. The patient was marked in an upright position along a natural occuring skin crease in the anterior neck.  The left neck was also marked for laterality. The patient was next taken to the operating room and placed in a supine position. General endotracheal anesthesia was induced with laryngeal monitor endotracheal tube.  Direct visualization by the surgeon of the tube electrodes in contact with the vocal cords was made.. The patient's anterior marked neck crease was neck injected with 7cc's of 0.25% marcaine with 1:200,000 Epinephrine. The patient was next prepped and draped in a sterile normal fashion.  At this time, a 15 blade scalpel was used to make a skin incision along a previously marked anterior neck crease. Dissection was carefully performed through the subcutaneous tissues with combination of Bovie electrocautery and blunt dissection.  The platysma was incised and anterior neck veins ligated with harmonic scalpel.  The median raphe of the strap muscles was divided in a linear fashion with Bovie electrocautery until the anterior border of the thyroid gland was identified.  The sternohyoid muscle was bluntly dissected away from the large left hemithyroid gland.  The lateral border of the thyroid and the carotid artery was  identified.  Dissection bluntly and with Bovie electrocautery was continued superiorly and inferiorly along the lateral edge of the thyroid.  The superior vessels were identified laterally and then medially with blunt dissection in Joel's space between the larynx and the superior thyroid pole.  Further dissection was continued inferiorly as well.  Once the superior pole was pedicled, the superior thyroid vessels were ligated with Harmonic Scalpel.  The left thyroid was retracted medially and this revealed Berry's ligament and the nodule of Zuckerkandl.  Just beneath this nodule, the recurrent laryngeal nerve was identified and stimulated robustly.  The inferior parathyroid was identified adjacent to the nerve along with the superior parathyroid as well.  These were identified and revealed good vascularization.  The nerve was tracked until its insertion into the larynx.  Next, the remaining attachments of Berry's ligament was divided and the isthmus was divided with Harmonic Scalpel.  The wound was copiously irrigated with sterile saline. Meticulous hemostasis with bipolar was obtained.  Visualization of an intact left recurrent laryngeal nerve that stimulated was made.  The parathyroid glands were intact and well perfused. Surgicel was placed in the wound bed. The wound was then closed in a multilayered fashion with vicryl for subcutaneous tissues and Dermabond for skin closure.  At this time the patient was extubated and taken to PACU in good condition.  Plan: Follow pathology.  Limit activity for 2 weeks. Follow up next week for post-operative evaluation.  Elsye Mccollister  12/22/2018 8:58 AM

## 2018-12-22 NOTE — Transfer of Care (Signed)
Immediate Anesthesia Transfer of Care Note  Patient: Timothy Best  Procedure(s) Performed: HEMI THYROIDECTOMY (Left )  Patient Location: PACU  Anesthesia Type:General  Level of Consciousness: awake and alert   Airway & Oxygen Therapy: Patient Spontanous Breathing and Patient connected to face mask oxygen  Post-op Assessment: Report given to RN and Post -op Vital signs reviewed and stable  Post vital signs: Reviewed and stable  Last Vitals:  Vitals Value Taken Time  BP 126/73 12/22/18 0914  Temp 36.4 C 12/22/18 0914  Pulse 95 12/22/18 0918  Resp 9 12/22/18 0918  SpO2 100 % 12/22/18 0918  Vitals shown include unvalidated device data.  Last Pain:  Vitals:   12/22/18 0914  TempSrc:   PainSc: (P) 0-No pain      Patients Stated Pain Goal: 0 ([id number])  Complications: No apparent anesthesia complications

## 2018-12-22 NOTE — Anesthesia Postprocedure Evaluation (Signed)
Anesthesia Post Note  Patient: Timothy Best  Procedure(s) Performed: HEMI THYROIDECTOMY (Left )  Patient location during evaluation: PACU Anesthesia Type: General Level of consciousness: awake and alert and oriented Pain management: pain level controlled Vital Signs Assessment: post-procedure vital signs reviewed and stable Respiratory status: spontaneous breathing, nonlabored ventilation and respiratory function stable Cardiovascular status: blood pressure returned to baseline and stable Postop Assessment: no signs of nausea or vomiting Anesthetic complications: no     Last Vitals:  Vitals:   12/22/18 1041 12/22/18 1105  BP: 130/87 137/82  Pulse: 71 73  Resp: 16 16  Temp:    SpO2: 98% 100%    Last Pain:  Vitals:   12/22/18 1105  TempSrc:   PainSc: 3                  Eh Sesay

## 2018-12-23 ENCOUNTER — Encounter: Payer: Self-pay | Admitting: Otolaryngology

## 2018-12-23 LAB — SURGICAL PATHOLOGY

## 2018-12-24 LAB — PATHOLOGY

## 2019-01-07 ENCOUNTER — Encounter: Payer: Self-pay | Admitting: Family Medicine

## 2019-01-08 ENCOUNTER — Other Ambulatory Visit: Payer: Self-pay | Admitting: Family Medicine

## 2019-01-08 DIAGNOSIS — F419 Anxiety disorder, unspecified: Secondary | ICD-10-CM

## 2019-01-08 MED ORDER — HYDROXYZINE PAMOATE 25 MG PO CAPS: 25.0000 mg | ORAL_CAPSULE | Freq: Three times a day (TID) | ORAL | 0 refills | Status: DC | PRN

## 2019-01-12 ENCOUNTER — Encounter: Payer: Self-pay | Admitting: Oncology

## 2019-01-14 ENCOUNTER — Other Ambulatory Visit: Payer: Self-pay | Admitting: Oncology

## 2019-01-17 NOTE — Progress Notes (Deleted)
[place]  Telephone:(336) [phone] Fax:(336) [phone]  ID: Timothy Best OB: 1975-08-12  MR#: [id number]  [id number]  Patient Care Team: Margo Common, PA as PCP - General (Family Medicine) Telford Nab, RN as Registered Nurse  CHIEF COMPLAINT: Multiple right lung masses.  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  His symptoms not resolve which led to a CT scan revealing multiple lung masses of unclear etiology.  He recently was placed on antibiotics with improvement of his fevers and chills.  He continues to have some mild pleuritic chest pain as well as shortness of breath, but otherwise feels well.  He has no neurologic complaints.  He denies any cough or hemoptysis.  He has a good appetite and denies weight loss.  He has no nausea, vomiting, constipation, or diarrhea.  He has no urinary complaints.  Patient offers no further complaints today.  REVIEW OF SYSTEMS:   Review of Systems  Constitutional: Negative.  Negative for fever, malaise/fatigue and weight loss.  Respiratory: Positive for shortness of breath. Negative for cough and hemoptysis.   Cardiovascular: Negative.  Negative for chest pain and leg swelling.  Gastrointestinal: Negative.  Negative for abdominal pain.  Genitourinary: Negative.  Negative for dysuria.  Musculoskeletal: Negative.  Negative for back pain.  Skin: Negative.   Neurological: Negative.  Negative for dizziness, focal weakness, weakness and headaches.  Psychiatric/Behavioral: Negative.  The patient is not nervous/anxious.     As per HPI. Otherwise, a complete review of systems is negative.  PAST MEDICAL HISTORY: Past Medical History:  Diagnosis Date  . Dermatitis   . Erectile dysfunction   . Glomerulonephritis   . Hyperlipidemia   . Hypertension   . Pneumonia   . Proteinuria     . Systemic lupus ([place])     PAST SURGICAL HISTORY: Past Surgical History:  Procedure Laterality Date  . NO PAST SURGERIES    . RENAL BIOPSY    . THYROIDECTOMY Left 12/22/2018   Procedure: HEMI THYROIDECTOMY;  Surgeon: Carloyn Manner, MD;  Location: [facility] ORS;  Service: ENT;  Laterality: Left;    FAMILY HISTORY: Family History  Problem Relation Age of Onset  . Allergies Mother   . Clotting disorder Father   . Allergies Brother     ADVANCED DIRECTIVES (Y/N):  N  HEALTH MAINTENANCE: Social History   Tobacco Use  . Smoking status: Former Smoker    Packs/day: 1.00    Years: 12.00    Pack years: 12.00    Types: Cigarettes  . Smokeless tobacco: Never Used  Substance Use Topics  . Alcohol use: Yes    Comment: occasionally   . Drug use: No     Colonoscopy:  PAP:  Bone density:  Lipid panel:  No Known Allergies  Current Outpatient Medications  Medication Sig Dispense Refill  . atorvastatin (LIPITOR) 20 MG tablet Take 20 mg by mouth daily.    . Azelastine-Fluticasone 137-50 MCG/ACT SUSP Place 1 spray into both nostrils 2 (two) times daily.    . benazepril (LOTENSIN) 20 MG tablet Take 20-40 mg by mouth See admin instructions. Take 40 mg by mouth in the morning and 20 mg in the evening    . hydroxychloroquine (PLAQUENIL) 200 MG tablet Take 400 mg by mouth daily.    . hydrOXYzine (VISTARIL) 25 MG capsule Take 1 capsule (25 mg total) by mouth 3 (three) times daily as needed. [place]  capsule 0  . Multiple Vitamin (MULTIVITAMIN WITH MINERALS) TABS tablet Take 1 tablet by mouth daily.    . Nutritional Supplements (DHEA PO) Take 1 capsule by mouth daily.    . ondansetron (ZOFRAN) 4 MG tablet Take 1 tablet (4 mg total) by mouth every 8 (eight) hours as needed for up to 10 doses for nausea or vomiting. 20 tablet 0  . torsemide (DEMADEX) 20 MG tablet Take 20 mg by mouth daily.      No current facility-administered medications for this visit.    OBJECTIVE: There were no vitals  filed for this visit.   There is no height or weight on file to calculate BMI.    ECOG FS:0 - Asymptomatic  General: Well-developed, well-nourished, no acute distress. Eyes: Pink conjunctiva, anicteric sclera. HEENT: Normocephalic, moist mucous membranes, clear oropharnyx. Lungs: Clear to auscultation bilaterally. Heart: Regular rate and rhythm. No rubs, murmurs, or gallops. Abdomen: Soft, nontender, nondistended. No organomegaly noted, normoactive bowel sounds. Musculoskeletal: No edema, cyanosis, or clubbing. Neuro: Alert, answering all questions appropriately. Cranial nerves grossly intact. Skin: No rashes or petechiae noted. Psych: Normal affect. Lymphatics: No cervical, calvicular, axillary or inguinal LAD.   LAB RESULTS:  Lab Results  Component Value Date   NA 138 12/18/2018   K 3.8 12/18/2018   CL 103 12/18/2018   CO2 26 12/18/2018   GLUCOSE 99 12/18/2018   BUN 20 12/18/2018   CREATININE 0.97 12/18/2018   CALCIUM 8.4 (L) 12/18/2018   PROT 6.0 04/07/2018   ALBUMIN 3.3 (L) 04/07/2018   AST 20 04/07/2018   ALT 34 04/07/2018   ALKPHOS 91 04/07/2018   BILITOT <0.2 04/07/2018   GFRNONAA >60 12/18/2018   GFRAA >60 12/18/2018    Lab Results  Component Value Date   WBC 5.8 12/18/2018   NEUTROABS 8.8 (H) 04/07/2018   HGB 14.9 12/18/2018   HCT 43.6 12/18/2018   MCV 84.7 12/18/2018   PLT 245 12/18/2018     STUDIES: No results found.  ASSESSMENT: Multiple right lung masses.  PLAN:    1. Multiple right lung masses: CT scan results from April 20, 2018 reviewed independently and reported as above.  Case also discussed with Dr. Mortimer Fries in pulmonology.  It was agreed upon that these appear more infectious in nature particularly since patient symptoms improved with antibiotics.  Although malignancy is a possibility, including lymphoma since he is immunocompromised, this is less likely.  Patient was given an additional course of 10 days of Levaquin and will have repeat CT scan  in 4 weeks.  He will have follow-up with pulmonology at that point.  If disease persists, would consider biopsy at that time.  No follow-up is necessary in the cancer center, patient can return to clinic if he has a biopsy and there is still suspicion of malignancy. 2.  Glomerulonephritis: Continue CellCept as prescribed.  I spent a total of 45 minutes face-to-face with the patient of which greater than 50% of the visit was spent in counseling and coordination of care as detailed above.   Patient expressed understanding and was in agreement with this plan. He also understands that He can call clinic at any time with any questions, concerns, or complaints.   Cancer Staging No matching staging information was found for the patient.  Lloyd Huger, MD   01/17/2019 10:04 AM

## 2019-01-21 ENCOUNTER — Ambulatory Visit: Payer: No Typology Code available for payment source

## 2019-01-21 ENCOUNTER — Ambulatory Visit: Payer: No Typology Code available for payment source | Admitting: Oncology

## 2019-01-21 ENCOUNTER — Other Ambulatory Visit: Payer: No Typology Code available for payment source

## 2019-01-21 DIAGNOSIS — M3214 Glomerular disease in systemic lupus erythematosus: Secondary | ICD-10-CM

## 2019-01-21 DIAGNOSIS — R918 Other nonspecific abnormal finding of lung field: Secondary | ICD-10-CM

## 2019-02-16 ENCOUNTER — Other Ambulatory Visit: Payer: Self-pay | Admitting: Oncology

## 2019-02-16 DIAGNOSIS — M3214 Glomerular disease in systemic lupus erythematosus: Secondary | ICD-10-CM

## 2019-02-19 NOTE — Progress Notes (Signed)
[name]  Telephone:(336) [phone] Fax:(336) [phone]  ID: ROLFE BENSINGER OB: 1975/07/07  MR#: [id number]  [id number]  Patient Care Team: Margo Common, PA as PCP - General (Family Medicine) Telford Nab, RN as Registered Nurse  CHIEF COMPLAINT: SLE, glomerulonephritis.  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  He currently feels well and is asymptomatic.  He has no neurologic complaints.  He denies any recent fevers or illnesses.  He has a good appetite and denies weight loss.  He has no chest pain, shortness of breath, cough, or hemoptysis.  He denies any nausea, vomiting, constipation, or diarrhea.  He has no urinary complaints.  Patient feels at his baseline offers no specific complaints today.    REVIEW OF SYSTEMS:   Review of Systems  Constitutional: Negative.  Negative for fever, malaise/fatigue and weight loss.  Respiratory: Negative.  Negative for cough, hemoptysis and shortness of breath.   Cardiovascular: Negative.  Negative for chest pain and leg swelling.  Gastrointestinal: Negative.  Negative for abdominal pain.  Genitourinary: Negative.  Negative for dysuria.  Musculoskeletal: Negative.  Negative for back pain.  Skin: Negative.   Neurological: Negative.  Negative for dizziness, focal weakness, weakness and headaches.  Psychiatric/Behavioral: Negative.  The patient is not nervous/anxious.     As per HPI. Otherwise, a complete review of systems is negative.  PAST MEDICAL HISTORY: Past Medical History:  Diagnosis Date  . Dermatitis   . Erectile dysfunction   . Glomerulonephritis   . Hyperlipidemia   . Hypertension   . Pneumonia   . Proteinuria   . Systemic lupus ([place])     PAST SURGICAL HISTORY: Past Surgical History:  Procedure Laterality Date  . NO PAST SURGERIES    . RENAL BIOPSY    . THYROIDECTOMY Left  12/22/2018   Procedure: HEMI THYROIDECTOMY;  Surgeon: Carloyn Manner, MD;  Location: [facility] ORS;  Service: ENT;  Laterality: Left;    FAMILY HISTORY: Family History  Problem Relation Age of Onset  . Allergies Mother   . Clotting disorder Father   . Allergies Brother     ADVANCED DIRECTIVES (Y/N):  N  HEALTH MAINTENANCE: Social History   Tobacco Use  . Smoking status: Former Smoker    Packs/day: 1.00    Years: 12.00    Pack years: 12.00    Types: Cigarettes  . Smokeless tobacco: Never Used  Substance Use Topics  . Alcohol use: Yes    Comment: occasionally   . Drug use: No     Colonoscopy:  PAP:  Bone density:  Lipid panel:  No Known Allergies  Current Outpatient Medications  Medication Sig Dispense Refill  . atorvastatin (LIPITOR) 20 MG tablet Take 20 mg by mouth daily.    . Azelastine-Fluticasone 137-50 MCG/ACT SUSP Place 1 spray into both nostrils 2 (two) times daily.    . benazepril (LOTENSIN) 20 MG tablet Take 20-40 mg by mouth See admin instructions. Take 40 mg by mouth in the morning and 20 mg in the evening    . hydroxychloroquine (PLAQUENIL) 200 MG tablet Take 400 mg by mouth daily.    . Multiple Vitamin (MULTIVITAMIN WITH MINERALS) TABS tablet Take 1 tablet by mouth daily.    . Nutritional Supplements (DHEA PO) Take 1 capsule by mouth daily.    Marland Kitchen torsemide (DEMADEX) 20 MG tablet Take 20 mg by mouth daily.      No current  facility-administered medications for this visit.    OBJECTIVE: Vitals:   02/23/19 0833  BP: 131/84  Pulse: 95  Resp: 18  Temp: (!) 96.5 F (35.8 C)     Body mass index is 27.8 kg/m.    ECOG FS:0 - Asymptomatic  General: Well-developed, well-nourished, no acute distress. Eyes: Pink conjunctiva, anicteric sclera. HEENT: Normocephalic, moist mucous membranes. Lungs: No audible wheezing or coughing. Heart: Regular rate and rhythm. Abdomen: Soft, nontender, no obvious distention. Musculoskeletal: No edema, cyanosis, or  clubbing. Neuro: Alert, answering all questions appropriately. Cranial nerves grossly intact. Skin: No rashes or petechiae noted. Psych: Normal affect.   LAB RESULTS:  Lab Results  Component Value Date   NA 138 12/18/2018   K 3.8 12/18/2018   CL 103 12/18/2018   CO2 26 12/18/2018   GLUCOSE 99 12/18/2018   BUN 20 12/18/2018   CREATININE 0.97 12/18/2018   CALCIUM 8.4 (L) 12/18/2018   PROT 6.0 04/07/2018   ALBUMIN 3.3 (L) 04/07/2018   AST 20 04/07/2018   ALT 34 04/07/2018   ALKPHOS 91 04/07/2018   BILITOT <0.2 04/07/2018   GFRNONAA >60 12/18/2018   GFRAA >60 12/18/2018    Lab Results  Component Value Date   WBC 5.2 02/23/2019   NEUTROABS 2.8 02/23/2019   HGB 15.7 02/23/2019   HCT 47.7 02/23/2019   MCV 88.2 02/23/2019   PLT 273 02/23/2019     STUDIES: No results found.  ASSESSMENT: SLE, glomerulonephritis.  PLAN:    1. SLE, glomerulonephritis: Patient recently was on CellCept, but this has been discontinued.  Plan is to receive 1000 mg IV Rituxan on days 1 and 15.  Patient expressed understanding that any questions or concerns regarding his glomerulonephritis or his treatment should be directed towards nephrology.  Much of his laboratory work will also be monitored by them.  Once patient has his day 15 treatment, no further follow-up will be needed.  Proceed with treatment today.  Return to clinic in 2 weeks for further evaluation and consideration of cycle 1, day 15 of Rituxan.  I spent a total of 45 minutes reviewing chart data, face-to-face evaluation with the patient, counseling and coordination of care as detailed above.   Patient expressed understanding and was in agreement with this plan. He also understands that He can call clinic at any time with any questions, concerns, or complaints.   Cancer Staging No matching staging information was found for the patient.  Lloyd Huger, MD   02/23/2019 12:06 PM

## 2019-02-23 ENCOUNTER — Other Ambulatory Visit: Payer: Self-pay

## 2019-02-23 ENCOUNTER — Inpatient Hospital Stay: Payer: No Typology Code available for payment source | Attending: Oncology | Admitting: Oncology

## 2019-02-23 ENCOUNTER — Inpatient Hospital Stay: Payer: No Typology Code available for payment source

## 2019-02-23 VITALS — BP 108/68 | HR 93 | Temp 96.4°F | Resp 18

## 2019-02-23 VITALS — BP 131/84 | HR 95 | Temp 96.5°F | Resp 18 | Ht 72.0 in | Wt 205.0 lb

## 2019-02-23 DIAGNOSIS — Z87891 Personal history of nicotine dependence: Secondary | ICD-10-CM | POA: Insufficient documentation

## 2019-02-23 DIAGNOSIS — M3214 Glomerular disease in systemic lupus erythematosus: Secondary | ICD-10-CM

## 2019-02-23 DIAGNOSIS — I10 Essential (primary) hypertension: Secondary | ICD-10-CM | POA: Diagnosis not present

## 2019-02-23 DIAGNOSIS — Z5112 Encounter for antineoplastic immunotherapy: Secondary | ICD-10-CM | POA: Diagnosis not present

## 2019-02-23 DIAGNOSIS — Z79899 Other long term (current) drug therapy: Secondary | ICD-10-CM | POA: Insufficient documentation

## 2019-02-23 DIAGNOSIS — E785 Hyperlipidemia, unspecified: Secondary | ICD-10-CM | POA: Insufficient documentation

## 2019-02-23 LAB — CBC WITH DIFFERENTIAL/PLATELET
Abs Immature Granulocytes: 0.02 10*3/uL (ref 0.00–0.07)
Basophils Absolute: 0 10*3/uL (ref 0.0–0.1)
Basophils Relative: 1 %
Eosinophils Absolute: 0.3 10*3/uL (ref 0.0–0.5)
Eosinophils Relative: 6 %
HCT: 47.7 % (ref 39.0–52.0)
Hemoglobin: 15.7 g/dL (ref 13.0–17.0)
Immature Granulocytes: 0 %
Lymphocytes Relative: 28 %
Lymphs Abs: 1.5 10*3/uL (ref 0.7–4.0)
MCH: 29 pg (ref 26.0–34.0)
MCHC: 32.9 g/dL (ref 30.0–36.0)
MCV: 88.2 fL (ref 80.0–100.0)
Monocytes Absolute: 0.6 10*3/uL (ref 0.1–1.0)
Monocytes Relative: 11 %
Neutro Abs: 2.8 10*3/uL (ref 1.7–7.7)
Neutrophils Relative %: 54 %
Platelets: 273 10*3/uL (ref 150–400)
RBC: 5.41 MIL/uL (ref 4.22–5.81)
RDW: 12.3 % (ref 11.5–15.5)
WBC: 5.2 10*3/uL (ref 4.0–10.5)
nRBC: 0 % (ref 0.0–0.2)

## 2019-02-23 LAB — HEPATITIS B SURFACE ANTIGEN: Hepatitis B Surface Ag: NONREACTIVE

## 2019-02-23 LAB — HEPATITIS B CORE ANTIBODY, TOTAL: Hep B Core Total Ab: NONREACTIVE

## 2019-02-23 MED ORDER — SODIUM CHLORIDE 0.9 % IV SOLN
1000.0000 mg | Freq: Once | INTRAVENOUS | Status: AC
  Administered 2019-02-23: 1000 mg via INTRAVENOUS
  Filled 2019-02-23: qty 100

## 2019-02-23 MED ORDER — DIPHENHYDRAMINE HCL 25 MG PO CAPS
25.0000 mg | ORAL_CAPSULE | Freq: Once | ORAL | Status: AC
  Administered 2019-02-23: 25 mg via ORAL
  Filled 2019-02-23: qty 1

## 2019-02-23 MED ORDER — SODIUM CHLORIDE 0.9 % IV SOLN
Freq: Once | INTRAVENOUS | Status: AC
  Filled 2019-02-23: qty 250

## 2019-02-23 MED ORDER — ACETAMINOPHEN 325 MG PO TABS
650.0000 mg | ORAL_TABLET | Freq: Once | ORAL | Status: AC
  Administered 2019-02-23: 650 mg via ORAL
  Filled 2019-02-23: qty 2

## 2019-02-23 NOTE — Progress Notes (Signed)
Pt tolerated infusion well. Pt denies any concerns, complaints or questions. No s/s of distress noted. Pt educated to call 91/report to ER in the event of an emergency or to call clinic with any questions or concerns. Pt verbalizes understanding. Pt and VS stable at discharge.

## 2019-03-05 NOTE — Progress Notes (Signed)
[place]  Telephone:(336) [phone] Fax:(336) [phone]  ID: Timothy Best OB: 12-11-1975  MR#: [id number]  [id number]  Patient Care Team: Margo Common, PA as PCP - General (Family Medicine) Telford Nab, RN as Registered Nurse  CHIEF COMPLAINT: SLE, glomerulonephritis.  INTERVAL HISTORY: Patient returns to clinic today for consideration of cycle 1, day 15 of Rituxan for his glomerulonephritis.  Patient tolerated his first treatment well without significant side effects.  He states he is already noticed a decrease of "foaminess" in his urine after his first treatment.  He currently feels well and is asymptomatic.  He has no neurologic complaints.  He denies any recent fevers or illnesses.  He has a good appetite and denies weight loss.  He has no chest pain, shortness of breath, cough, or hemoptysis.  He denies any nausea, vomiting, constipation, or diarrhea.  He has no urinary complaints.  Patient offers no specific complaints today.    REVIEW OF SYSTEMS:   Review of Systems  Constitutional: Negative.  Negative for fever, malaise/fatigue and weight loss.  Respiratory: Negative.  Negative for cough, hemoptysis and shortness of breath.   Cardiovascular: Negative.  Negative for chest pain and leg swelling.  Gastrointestinal: Negative.  Negative for abdominal pain.  Genitourinary: Negative.  Negative for dysuria.  Musculoskeletal: Negative.  Negative for back pain.  Skin: Negative.   Neurological: Negative.  Negative for dizziness, focal weakness, weakness and headaches.  Psychiatric/Behavioral: Negative.  The patient is not nervous/anxious.     As per HPI. Otherwise, a complete review of systems is negative.  PAST MEDICAL HISTORY: Past Medical History:  Diagnosis Date  . Dermatitis   . Erectile dysfunction   . Glomerulonephritis   . Hyperlipidemia   . Hypertension   . Pneumonia   . Proteinuria   . Systemic lupus ([place])     PAST SURGICAL  HISTORY: Past Surgical History:  Procedure Laterality Date  . NO PAST SURGERIES    . RENAL BIOPSY    . THYROIDECTOMY Left 12/22/2018   Procedure: HEMI THYROIDECTOMY;  Surgeon: Carloyn Manner, MD;  Location: [facility] ORS;  Service: ENT;  Laterality: Left;    FAMILY HISTORY: Family History  Problem Relation Age of Onset  . Allergies Mother   . Clotting disorder Father   . Allergies Brother     ADVANCED DIRECTIVES (Y/N):  N  HEALTH MAINTENANCE: Social History   Tobacco Use  . Smoking status: Former Smoker    Packs/day: 1.00    Years: 12.00    Pack years: 12.00    Types: Cigarettes  . Smokeless tobacco: Never Used  Substance Use Topics  . Alcohol use: Yes    Comment: occasionally   . Drug use: No     Colonoscopy:  PAP:  Bone density:  Lipid panel:  No Known Allergies  Current Outpatient Medications  Medication Sig Dispense Refill  . atorvastatin (LIPITOR) 20 MG tablet Take 20 mg by mouth daily.    . Azelastine-Fluticasone 137-50 MCG/ACT SUSP Place 1 spray into both nostrils 2 (two) times daily.    . benazepril (LOTENSIN) 20 MG tablet Take 20-40 mg by mouth See admin instructions. Take 40 mg by mouth in the morning and 20 mg in the evening    . hydroxychloroquine (PLAQUENIL) 200 MG tablet Take 400 mg by mouth daily.    . Multiple Vitamin (MULTIVITAMIN WITH MINERALS) TABS tablet Take 1 tablet by mouth daily.    . Nutritional Supplements (DHEA PO) Take 1 capsule by mouth daily.    Marland Kitchen  torsemide (DEMADEX) 20 MG tablet Take 20 mg by mouth daily.      No current facility-administered medications for this visit.    OBJECTIVE: Vitals:   03/09/19 0839  BP: 119/80  Pulse: 90  Resp: 16  Temp: (!) 97.2 F (36.2 C)     Body mass index is 27.87 kg/m.    ECOG FS:0 - Asymptomatic  General: Well-developed, well-nourished, no acute distress. Eyes: Pink conjunctiva, anicteric sclera. HEENT: Normocephalic, moist mucous membranes. Lungs: No audible wheezing or  coughing. Heart: Regular rate and rhythm. Abdomen: Soft, nontender, no obvious distention. Musculoskeletal: No edema, cyanosis, or clubbing. Neuro: Alert, answering all questions appropriately. Cranial nerves grossly intact. Skin: No rashes or petechiae noted. Psych: Normal affect.   LAB RESULTS:  Lab Results  Component Value Date   NA 138 12/18/2018   K 3.8 12/18/2018   CL 103 12/18/2018   CO2 26 12/18/2018   GLUCOSE 99 12/18/2018   BUN 20 12/18/2018   CREATININE 0.97 12/18/2018   CALCIUM 8.4 (L) 12/18/2018   PROT 6.0 04/07/2018   ALBUMIN 3.3 (L) 04/07/2018   AST 20 04/07/2018   ALT 34 04/07/2018   ALKPHOS 91 04/07/2018   BILITOT <0.2 04/07/2018   GFRNONAA >60 12/18/2018   GFRAA >60 12/18/2018    Lab Results  Component Value Date   WBC 5.1 03/09/2019   NEUTROABS 2.8 03/09/2019   HGB 15.3 03/09/2019   HCT 46.6 03/09/2019   MCV 88.9 03/09/2019   PLT 276 03/09/2019     STUDIES: No results found.  ASSESSMENT: SLE, glomerulonephritis.  PLAN:    1. SLE, glomerulonephritis: Patient recently was on CellCept, but this has been discontinued.  Plan is to receive 1000 mg IV Rituxan on days 1 and 15.  Patient expressed understanding that any questions or concerns regarding his glomerulonephritis or his treatment should be directed towards nephrology.  Much of his laboratory work will also be monitored by them.  Proceed with cycle 1, day 15 today.  No further follow-up has been scheduled.  Please refer patient back if there is additional need for Rituxan or if you have any questions or concerns.    I spent a total of 30 minutes reviewing chart data, face-to-face evaluation with the patient, counseling and coordination of care as detailed above.   Patient expressed understanding and was in agreement with this plan. He also understands that He can call clinic at any time with any questions, concerns, or complaints.    Lloyd Huger, MD   03/09/2019 9:14 AM

## 2019-03-09 ENCOUNTER — Inpatient Hospital Stay: Payer: No Typology Code available for payment source | Attending: Oncology

## 2019-03-09 ENCOUNTER — Encounter: Payer: Self-pay | Admitting: Oncology

## 2019-03-09 ENCOUNTER — Other Ambulatory Visit: Payer: Self-pay

## 2019-03-09 ENCOUNTER — Inpatient Hospital Stay (HOSPITAL_BASED_OUTPATIENT_CLINIC_OR_DEPARTMENT_OTHER): Payer: No Typology Code available for payment source | Admitting: Oncology

## 2019-03-09 ENCOUNTER — Inpatient Hospital Stay: Payer: No Typology Code available for payment source

## 2019-03-09 VITALS — BP 130/76 | HR 71 | Temp 96.1°F | Resp 18

## 2019-03-09 VITALS — BP 119/80 | HR 90 | Temp 97.2°F | Resp 16 | Wt 205.5 lb

## 2019-03-09 DIAGNOSIS — Z79899 Other long term (current) drug therapy: Secondary | ICD-10-CM | POA: Diagnosis not present

## 2019-03-09 DIAGNOSIS — I10 Essential (primary) hypertension: Secondary | ICD-10-CM | POA: Diagnosis not present

## 2019-03-09 DIAGNOSIS — M3214 Glomerular disease in systemic lupus erythematosus: Secondary | ICD-10-CM

## 2019-03-09 DIAGNOSIS — E785 Hyperlipidemia, unspecified: Secondary | ICD-10-CM | POA: Insufficient documentation

## 2019-03-09 DIAGNOSIS — Z87891 Personal history of nicotine dependence: Secondary | ICD-10-CM | POA: Diagnosis not present

## 2019-03-09 DIAGNOSIS — M329 Systemic lupus erythematosus, unspecified: Secondary | ICD-10-CM | POA: Insufficient documentation

## 2019-03-09 DIAGNOSIS — Z5112 Encounter for antineoplastic immunotherapy: Secondary | ICD-10-CM | POA: Insufficient documentation

## 2019-03-09 LAB — CBC WITH DIFFERENTIAL/PLATELET
Abs Immature Granulocytes: 0.06 10*3/uL (ref 0.00–0.07)
Basophils Absolute: 0 10*3/uL (ref 0.0–0.1)
Basophils Relative: 1 %
Eosinophils Absolute: 0.4 10*3/uL (ref 0.0–0.5)
Eosinophils Relative: 8 %
HCT: 46.6 % (ref 39.0–52.0)
Hemoglobin: 15.3 g/dL (ref 13.0–17.0)
Immature Granulocytes: 1 %
Lymphocytes Relative: 23 %
Lymphs Abs: 1.2 10*3/uL (ref 0.7–4.0)
MCH: 29.2 pg (ref 26.0–34.0)
MCHC: 32.8 g/dL (ref 30.0–36.0)
MCV: 88.9 fL (ref 80.0–100.0)
Monocytes Absolute: 0.7 10*3/uL (ref 0.1–1.0)
Monocytes Relative: 13 %
Neutro Abs: 2.8 10*3/uL (ref 1.7–7.7)
Neutrophils Relative %: 54 %
Platelets: 276 10*3/uL (ref 150–400)
RBC: 5.24 MIL/uL (ref 4.22–5.81)
RDW: 12.5 % (ref 11.5–15.5)
WBC: 5.1 10*3/uL (ref 4.0–10.5)
nRBC: 0 % (ref 0.0–0.2)

## 2019-03-09 MED ORDER — ACETAMINOPHEN 325 MG PO TABS
650.0000 mg | ORAL_TABLET | Freq: Once | ORAL | Status: AC
  Administered 2019-03-09: 650 mg via ORAL
  Filled 2019-03-09: qty 2

## 2019-03-09 MED ORDER — SODIUM CHLORIDE 0.9 % IV SOLN: 1000.0000 mg | Freq: Once | INTRAVENOUS | Status: DC

## 2019-03-09 MED ORDER — DIPHENHYDRAMINE HCL 25 MG PO CAPS
25.0000 mg | ORAL_CAPSULE | Freq: Once | ORAL | Status: AC
  Administered 2019-03-09: 10:00:00 25 mg via ORAL
  Filled 2019-03-09: qty 1

## 2019-03-09 MED ORDER — SODIUM CHLORIDE 0.9 % IV SOLN
Freq: Once | INTRAVENOUS | Status: AC
  Filled 2019-03-09: qty 250

## 2019-03-09 MED ORDER — SODIUM CHLORIDE 0.9 % IV SOLN
1000.0000 mg | Freq: Once | INTRAVENOUS | Status: AC
  Administered 2019-03-09: 1000 mg via INTRAVENOUS
  Filled 2019-03-09: qty 100

## 2019-03-09 NOTE — Progress Notes (Signed)
Pt tolerated infusion well. Pt denies any concerns, no s/s of distress noted. Pt and VS stable at discharge.  

## 2019-03-09 NOTE — Progress Notes (Signed)
Pt here for follow up. No complaints or concerns.  

## 2019-08-23 DIAGNOSIS — E785 Hyperlipidemia, unspecified: Secondary | ICD-10-CM | POA: Insufficient documentation

## 2019-08-23 DIAGNOSIS — N08 Glomerular disorders in diseases classified elsewhere: Secondary | ICD-10-CM | POA: Insufficient documentation

## 2019-08-23 DIAGNOSIS — I10 Essential (primary) hypertension: Secondary | ICD-10-CM | POA: Insufficient documentation

## 2019-09-02 ENCOUNTER — Other Ambulatory Visit: Payer: Self-pay | Admitting: Oncology

## 2019-09-02 IMAGING — CT CT CHEST WITHOUT CONTRAST
2 of 4 series · 15 of 36 positions shown, 18 images · non-contrast
Comparison: 04/20/2018 chest CT.

CLINICAL DATA: Follow-up pulmonary nodularity and thoracic
adenopathy. History of smoking. History of immunosuppression for
lupus. Interval antibiotic therapy for pneumonia.

EXAM:
CT CHEST WITHOUT CONTRAST
TECHNIQUE: Multidetector CT imaging of the chest was performed following the
standard protocol without IV contrast.

[Series 4: chest · axial · 0.66mm/px · z∈[-1188,-914]mm · 12 of 163 slices shown, 15 images (1 of 2)]
[im 13/163  mediastinal]
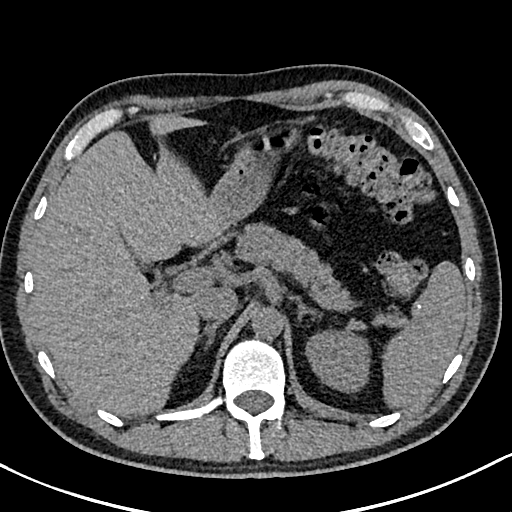
[im 13/163  lung]
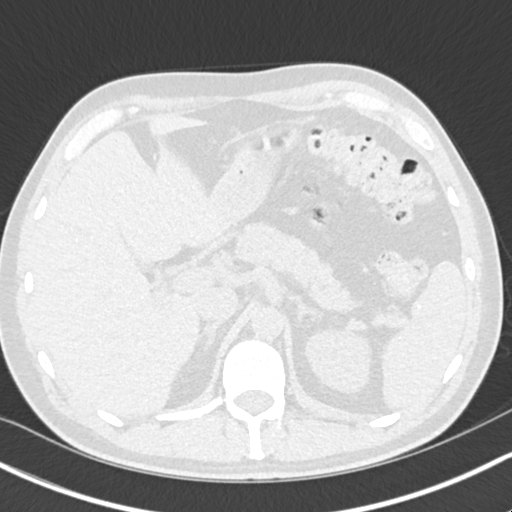
[im 25/163  lung]
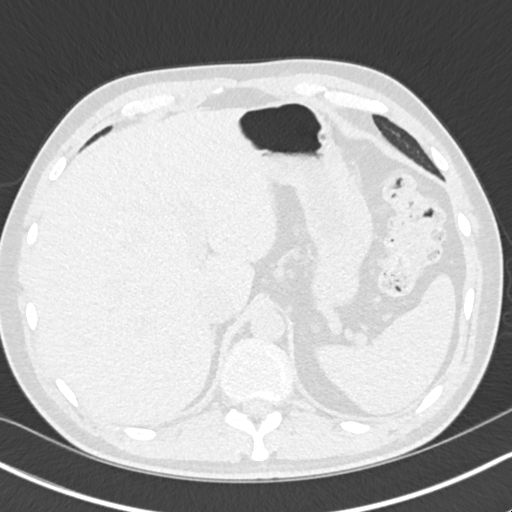
[im 38/163  lung]
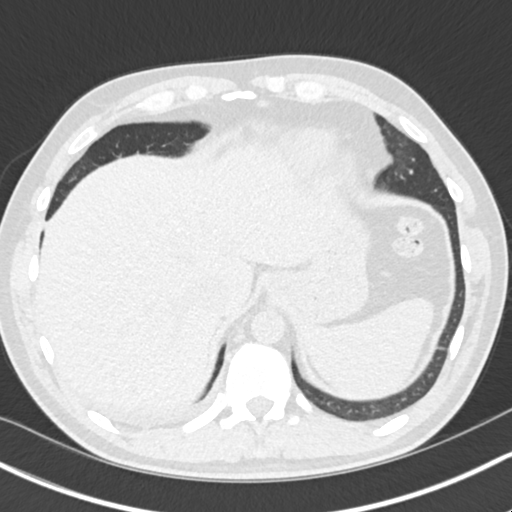
[im 50/163  lung]
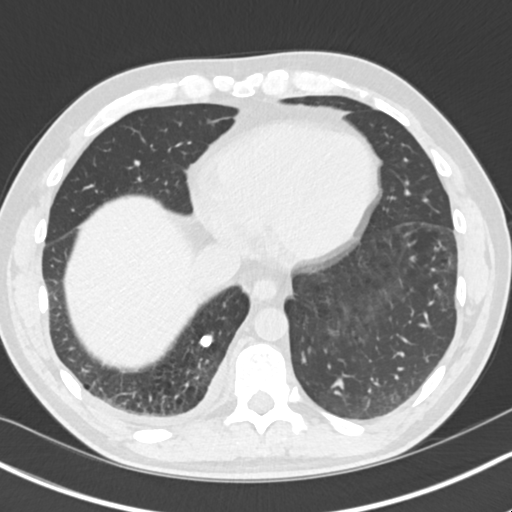
[im 63/163  mediastinal]
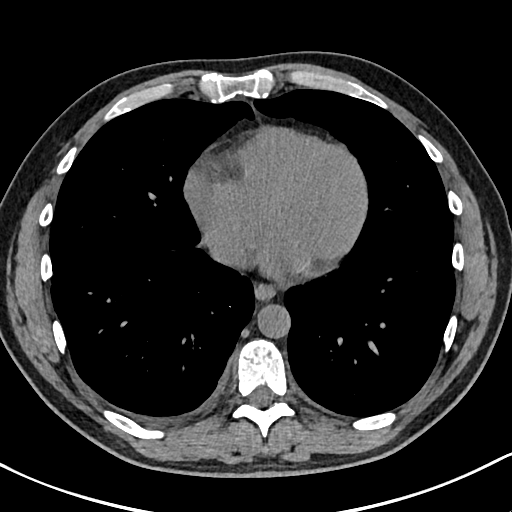
[im 63/163  lung]
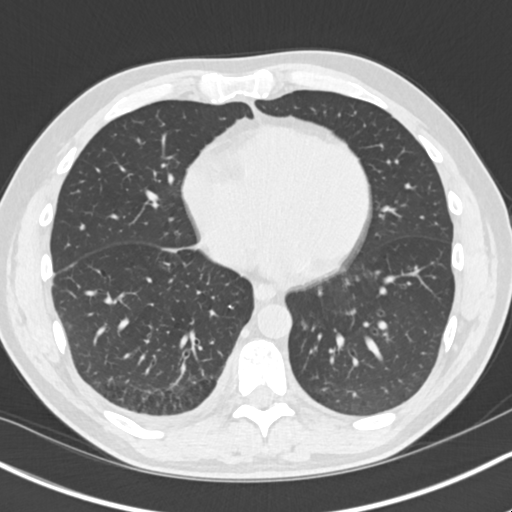
[im 75/163  lung]
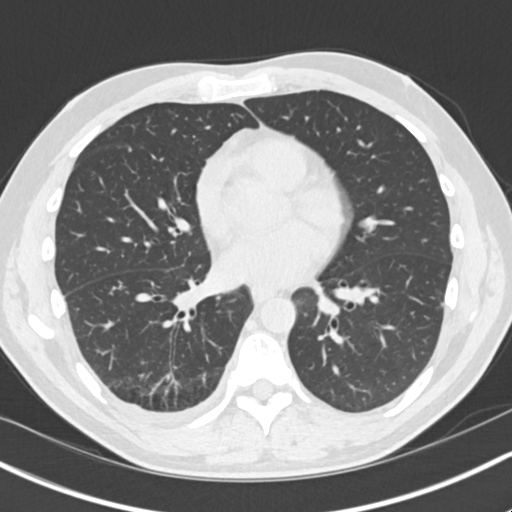
[im 88/163  lung]
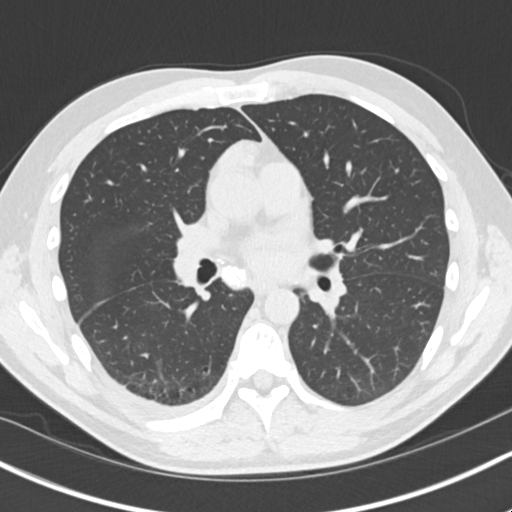
[im 100/163  lung]
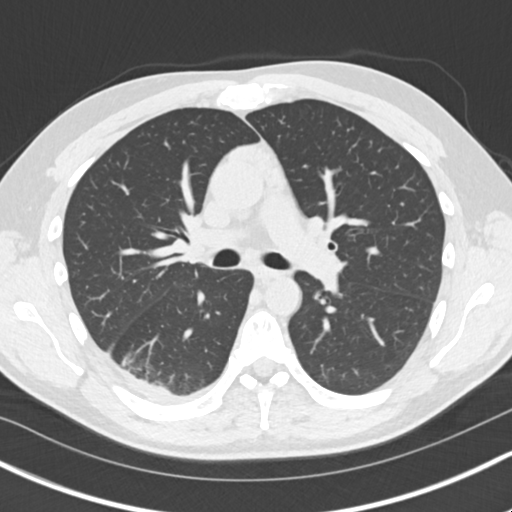
[im 113/163  mediastinal]
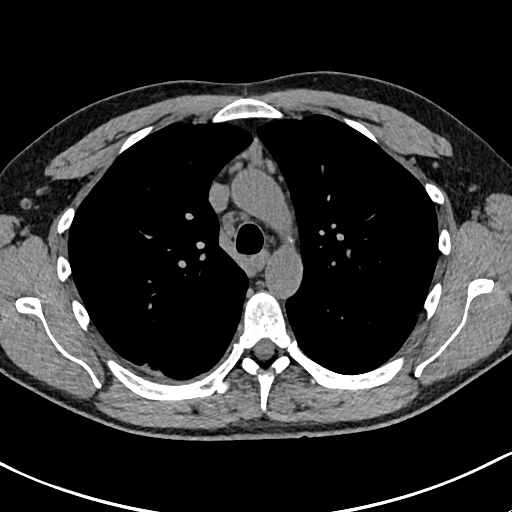
[im 113/163  lung]
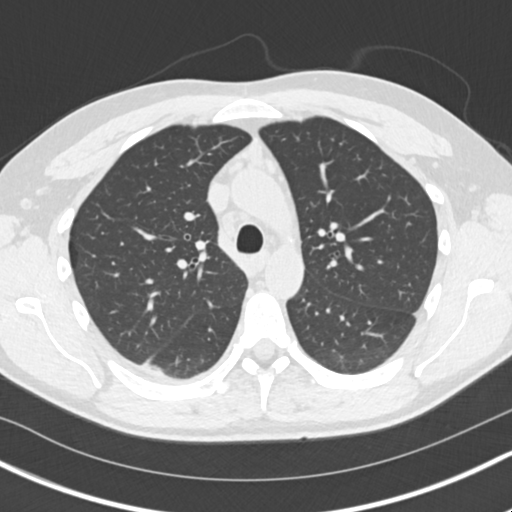
[im 125/163  lung]
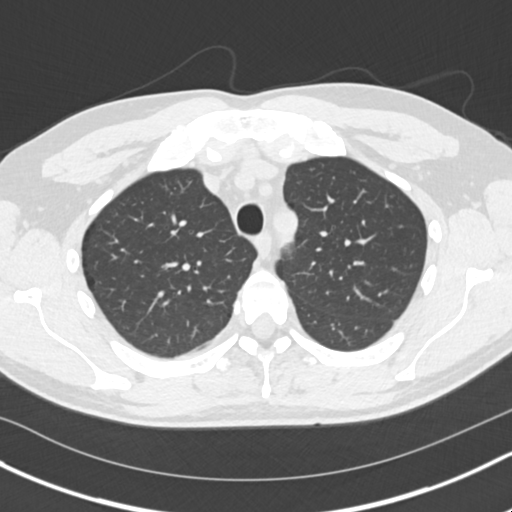
[im 138/163  lung]
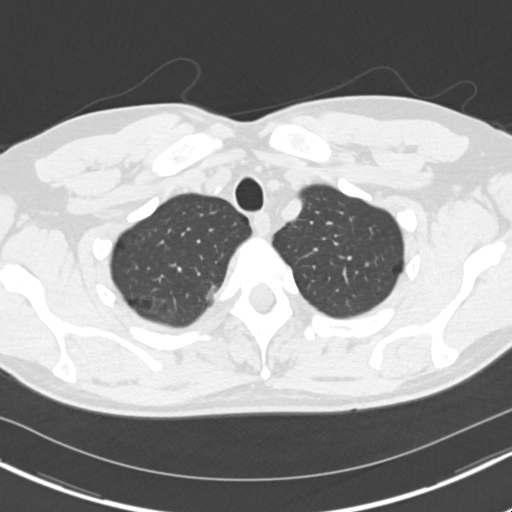
[im 150/163  lung]
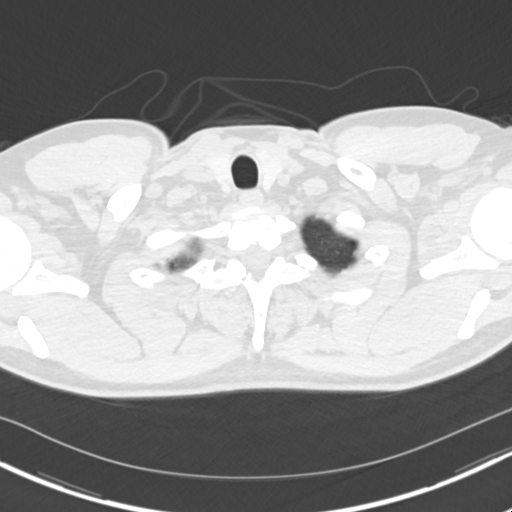

[Series 7: chest · coronal · 0.64mm/px · 3 of 143 slices shown (2 of 2)]
[im 29/143  lung]
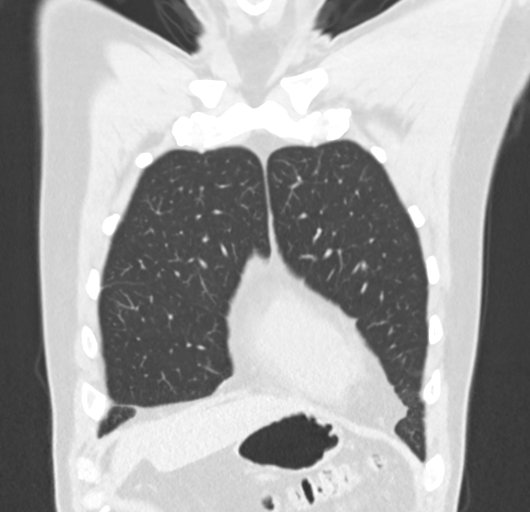
[im 57/143  lung]
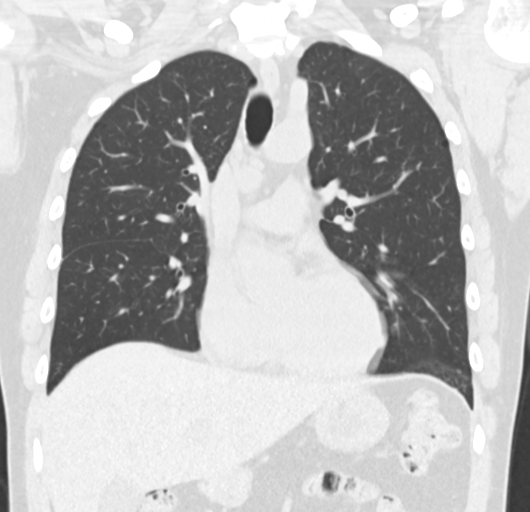
[im 86/143  lung]
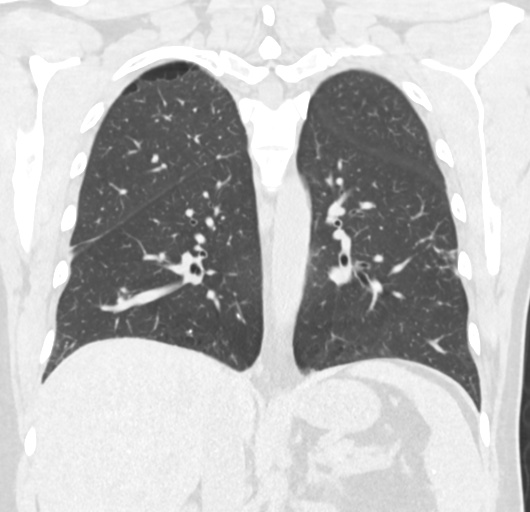

[15 of 36 positions shown; findings below may reference images not displayed]

FINDINGS: Cardiovascular: Normal heart size. No significant pericardial
effusion/thickening. Left anterior descending coronary
atherosclerosis. Great vessels are normal in course and caliber.

Mediastinum/Nodes: Hypodense 1.3 cm anterior left thyroid nodule,
not discretely visualized on prior chest CT. Unremarkable esophagus.
No axillary adenopathy. Mildly enlarged 1.4 cm right paratracheal
node (series 4/image 59), significantly decreased from 2.7 cm on
04/20/2018 chest CT. No new pathologically enlarged mediastinal
nodes. Coarsely calcified subcarinal and right hilar nodes from
prior granulomatous disease, unchanged. No discrete pathologically
enlarged hilar nodes on this noncontrast scan. Previously visualized
mild right hilar adenopathy appears resolved.

Lungs/Pleura: No pneumothorax. Trace dependent right pleural
effusion, minimally increased. No left pleural effusion. Mild
centrilobular and paraseptal emphysema. Previously visualized 3.4 x
2.3 cm nodular focus of consolidation in the medial posterior apical
right upper lobe has resolved, with minimal residual ground-glass
opacity in this location. Previously visualized peripheral right
lower lobe masslike focus of consolidation measuring 3.4 x 1.5 cm
has decreased to 2.7 x 1.1 cm (series 5/image 67). Focal patchy
tree-in-bud opacity in anterior peripheral left lower lobe is
stable. No interval consolidative airspace disease. No new
significant pulmonary nodules. Stable scattered calcified right
lower lobe granulomas.

Upper abdomen: Scattered subcentimeter hypodense left liver lobe
lesions are too small to characterize and appear unchanged. Stable
scattered punctate calcified splenic granulomas.

Musculoskeletal:  No aggressive appearing focal osseous lesions.
IMPRESSION: 1. Medial right upper lobe nodular focus of consolidation has
resolved. Peripheral right lower lobe masslike focus of
consolidation has decreased in size.
2. Right mediastinal and right hilar lymphadenopathy has decreased.
3. Findings suggest resolving multilobar pneumonia and resolving
reactive thoracic adenopathy. Suggest follow-up chest CT in 3-6
months to document complete resolution of these findings in this
high risk patient with smoking related changes in the lungs.
4. Trace dependent right pleural effusion, slightly increased.
5. One vessel coronary atherosclerosis.
6. Focal patchy tree-in-bud opacity in the peripheral left lower
lobe is stable and probably postinflammatory.

Emphysema (CS682-S0Q.J).

## 2019-09-05 NOTE — Progress Notes (Deleted)
[place]  Telephone:(336) [phone] Fax:(336) [phone]  ID: Timothy Best OB: Apr 24, 1975  MR#: [id number]  OVF#:[id number]  Patient Care Team: Margo Common, PA as PCP - General (Family Medicine) Telford Nab, RN as Registered Nurse  CHIEF COMPLAINT: SLE, glomerulonephritis.  INTERVAL HISTORY: Patient returns to clinic today for consideration of cycle 1, day 15 of Rituxan for his glomerulonephritis.  Patient tolerated his first treatment well without significant side effects.  He states he is already noticed a decrease of "foaminess" in his urine after his first treatment.  He currently feels well and is asymptomatic.  He has no neurologic complaints.  He denies any recent fevers or illnesses.  He has a good appetite and denies weight loss.  He has no chest pain, shortness of breath, cough, or hemoptysis.  He denies any nausea, vomiting, constipation, or diarrhea.  He has no urinary complaints.  Patient offers no specific complaints today.    REVIEW OF SYSTEMS:   Review of Systems  Constitutional: Negative.  Negative for fever, malaise/fatigue and weight loss.  Respiratory: Negative.  Negative for cough, hemoptysis and shortness of breath.   Cardiovascular: Negative.  Negative for chest pain and leg swelling.  Gastrointestinal: Negative.  Negative for abdominal pain.  Genitourinary: Negative.  Negative for dysuria.  Musculoskeletal: Negative.  Negative for back pain.  Skin: Negative.   Neurological: Negative.  Negative for dizziness, focal weakness, weakness and headaches.  Psychiatric/Behavioral: Negative.  The patient is not nervous/anxious.     As per HPI. Otherwise, a complete review of systems is negative.  PAST MEDICAL HISTORY: Past Medical History:  Diagnosis Date  . Dermatitis   . Erectile dysfunction   . Glomerulonephritis   . Hyperlipidemia   . Hypertension   . Pneumonia   . Proteinuria   . Systemic lupus ([place])     PAST SURGICAL  HISTORY: Past Surgical History:  Procedure Laterality Date  . NO PAST SURGERIES    . RENAL BIOPSY    . THYROIDECTOMY Left 12/22/2018   Procedure: HEMI THYROIDECTOMY;  Surgeon: Carloyn Manner, MD;  Location: [facility] ORS;  Service: ENT;  Laterality: Left;    FAMILY HISTORY: Family History  Problem Relation Age of Onset  . Allergies Mother   . Clotting disorder Father   . Allergies Brother     ADVANCED DIRECTIVES (Y/N):  N  HEALTH MAINTENANCE: Social History   Tobacco Use  . Smoking status: Former Smoker    Packs/day: 1.00    Years: 12.00    Pack years: 12.00    Types: Cigarettes  . Smokeless tobacco: Never Used  Vaping Use  . Vaping Use: Never used  Substance Use Topics  . Alcohol use: Yes    Comment: occasionally   . Drug use: No     Colonoscopy:  PAP:  Bone density:  Lipid panel:  No Known Allergies  Current Outpatient Medications  Medication Sig Dispense Refill  . atorvastatin (LIPITOR) 20 MG tablet Take 20 mg by mouth daily.    . Azelastine-Fluticasone 137-50 MCG/ACT SUSP Place 1 spray into both nostrils 2 (two) times daily.    . benazepril (LOTENSIN) 20 MG tablet Take 20-40 mg by mouth See admin instructions. Take 40 mg by mouth in the morning and 20 mg in the evening    . hydroxychloroquine (PLAQUENIL) 200 MG tablet Take 400 mg by mouth daily.    . Multiple Vitamin (MULTIVITAMIN WITH MINERALS) TABS tablet Take 1 tablet by mouth daily.    . Nutritional  Supplements (DHEA PO) Take 1 capsule by mouth daily.    Marland Kitchen torsemide (DEMADEX) 20 MG tablet Take 20 mg by mouth daily.      No current facility-administered medications for this visit.    OBJECTIVE: There were no vitals filed for this visit.   There is no height or weight on file to calculate BMI.    ECOG FS:0 - Asymptomatic  General: Well-developed, well-nourished, no acute distress. Eyes: Pink conjunctiva, anicteric sclera. HEENT: Normocephalic, moist mucous membranes. Lungs: No audible wheezing or  coughing. Heart: Regular rate and rhythm. Abdomen: Soft, nontender, no obvious distention. Musculoskeletal: No edema, cyanosis, or clubbing. Neuro: Alert, answering all questions appropriately. Cranial nerves grossly intact. Skin: No rashes or petechiae noted. Psych: Normal affect.   LAB RESULTS:  Lab Results  Component Value Date   NA 138 12/18/2018   K 3.8 12/18/2018   CL 103 12/18/2018   CO2 26 12/18/2018   GLUCOSE 99 12/18/2018   BUN 20 12/18/2018   CREATININE 0.97 12/18/2018   CALCIUM 8.4 (L) 12/18/2018   PROT 6.0 04/07/2018   ALBUMIN 3.3 (L) 04/07/2018   AST 20 04/07/2018   ALT 34 04/07/2018   ALKPHOS 91 04/07/2018   BILITOT <0.2 04/07/2018   GFRNONAA >60 12/18/2018   GFRAA >60 12/18/2018    Lab Results  Component Value Date   WBC 5.1 03/09/2019   NEUTROABS 2.8 03/09/2019   HGB 15.3 03/09/2019   HCT 46.6 03/09/2019   MCV 88.9 03/09/2019   PLT 276 03/09/2019     STUDIES: No results found.  ASSESSMENT: SLE, glomerulonephritis.  PLAN:    1. SLE, glomerulonephritis: Patient recently was on CellCept, but this has been discontinued.  Plan is to receive 1000 mg IV Rituxan on days 1 and 15.  Patient expressed understanding that any questions or concerns regarding his glomerulonephritis or his treatment should be directed towards nephrology.  Much of his laboratory work will also be monitored by them.  Proceed with cycle 1, day 15 today.  No further follow-up has been scheduled.  Please refer patient back if there is additional need for Rituxan or if you have any questions or concerns.    I spent a total of 30 minutes reviewing chart data, face-to-face evaluation with the patient, counseling and coordination of care as detailed above.   Patient expressed understanding and was in agreement with this plan. He also understands that He can call clinic at any time with any questions, concerns, or complaints.    Lloyd Huger, MD   09/05/2019 10:03 AM

## 2019-09-10 ENCOUNTER — Inpatient Hospital Stay: Payer: No Typology Code available for payment source | Admitting: Oncology

## 2019-09-10 ENCOUNTER — Inpatient Hospital Stay: Payer: No Typology Code available for payment source

## 2019-09-18 NOTE — Progress Notes (Deleted)
[place]  Telephone:(336) [phone] Fax:(336) [phone]  ID: Timothy Best OB: [birth year]  MR#: [id number]  SEG#:[id number]  Patient Care Team: Margo Common, PA as PCP - General (Family Medicine) Telford Nab, RN as Registered Nurse  CHIEF COMPLAINT: SLE, glomerulonephritis.  INTERVAL HISTORY: Patient returns to clinic today for consideration of cycle 1, day 15 of Rituxan for his glomerulonephritis.  Patient tolerated his first treatment well without significant side effects.  He states he is already noticed a decrease of "foaminess" in his urine after his first treatment.  He currently feels well and is asymptomatic.  He has no neurologic complaints.  He denies any recent fevers or illnesses.  He has a good appetite and denies weight loss.  He has no chest pain, shortness of breath, cough, or hemoptysis.  He denies any nausea, vomiting, constipation, or diarrhea.  He has no urinary complaints.  Patient offers no specific complaints today.    REVIEW OF SYSTEMS:   Review of Systems  Constitutional: Negative.  Negative for fever, malaise/fatigue and weight loss.  Respiratory: Negative.  Negative for cough, hemoptysis and shortness of breath.   Cardiovascular: Negative.  Negative for chest pain and leg swelling.  Gastrointestinal: Negative.  Negative for abdominal pain.  Genitourinary: Negative.  Negative for dysuria.  Musculoskeletal: Negative.  Negative for back pain.  Skin: Negative.   Neurological: Negative.  Negative for dizziness, focal weakness, weakness and headaches.  Psychiatric/Behavioral: Negative.  The patient is not nervous/anxious.     As per HPI. Otherwise, a complete review of systems is negative.  PAST MEDICAL HISTORY: Past Medical History:  Diagnosis Date  . Dermatitis   . Erectile dysfunction   . Glomerulonephritis   . Hyperlipidemia   . Hypertension   . Pneumonia   . Proteinuria   . Systemic lupus ([place])     PAST SURGICAL  HISTORY: Past Surgical History:  Procedure Laterality Date  . NO PAST SURGERIES    . RENAL BIOPSY    . THYROIDECTOMY Left 12/22/2018   Procedure: HEMI THYROIDECTOMY;  Surgeon: Carloyn Manner, MD;  Location: [facility] ORS;  Service: ENT;  Laterality: Left;    FAMILY HISTORY: Family History  Problem Relation Age of Onset  . Allergies Mother   . Clotting disorder Father   . Allergies Brother     ADVANCED DIRECTIVES (Y/N):  N  HEALTH MAINTENANCE: Social History   Tobacco Use  . Smoking status: Former Smoker    Packs/day: 1.00    Years: 12.00    Pack years: 12.00    Types: Cigarettes  . Smokeless tobacco: Never Used  Vaping Use  . Vaping Use: Never used  Substance Use Topics  . Alcohol use: Yes    Comment: occasionally   . Drug use: No     Colonoscopy:  PAP:  Bone density:  Lipid panel:  No Known Allergies  Current Outpatient Medications  Medication Sig Dispense Refill  . atorvastatin (LIPITOR) 20 MG tablet Take 20 mg by mouth daily.    . Azelastine-Fluticasone 137-50 MCG/ACT SUSP Place 1 spray into both nostrils 2 (two) times daily.    . benazepril (LOTENSIN) 20 MG tablet Take 20-40 mg by mouth See admin instructions. Take 40 mg by mouth in the morning and 20 mg in the evening    . hydroxychloroquine (PLAQUENIL) 200 MG tablet Take 400 mg by mouth daily.    . Multiple Vitamin (MULTIVITAMIN WITH MINERALS) TABS tablet Take 1 tablet by mouth daily.    . Nutritional  Supplements (DHEA PO) Take 1 capsule by mouth daily.    Marland Kitchen torsemide (DEMADEX) 20 MG tablet Take 20 mg by mouth daily.      No current facility-administered medications for this visit.    OBJECTIVE: There were no vitals filed for this visit.   There is no height or weight on file to calculate BMI.    ECOG FS:0 - Asymptomatic  General: Well-developed, well-nourished, no acute distress. Eyes: Pink conjunctiva, anicteric sclera. HEENT: Normocephalic, moist mucous membranes. Lungs: No audible wheezing or  coughing. Heart: Regular rate and rhythm. Abdomen: Soft, nontender, no obvious distention. Musculoskeletal: No edema, cyanosis, or clubbing. Neuro: Alert, answering all questions appropriately. Cranial nerves grossly intact. Skin: No rashes or petechiae noted. Psych: Normal affect.   LAB RESULTS:  Lab Results  Component Value Date   NA 138 12/18/2018   K 3.8 12/18/2018   CL 103 12/18/2018   CO2 26 12/18/2018   GLUCOSE 99 12/18/2018   BUN 20 12/18/2018   CREATININE 0.97 12/18/2018   CALCIUM 8.4 (L) 12/18/2018   PROT 6.0 04/07/2018   ALBUMIN 3.3 (L) 04/07/2018   AST 20 04/07/2018   ALT 34 04/07/2018   ALKPHOS 91 04/07/2018   BILITOT <0.2 04/07/2018   GFRNONAA >60 12/18/2018   GFRAA >60 12/18/2018    Lab Results  Component Value Date   WBC 5.1 03/09/2019   NEUTROABS 2.8 03/09/2019   HGB 15.3 03/09/2019   HCT 46.6 03/09/2019   MCV 88.9 03/09/2019   PLT 276 03/09/2019     STUDIES: No results found.  ASSESSMENT: SLE, glomerulonephritis.  PLAN:    1. SLE, glomerulonephritis: Patient recently was on CellCept, but this has been discontinued.  Plan is to receive 1000 mg IV Rituxan on days 1 and 15.  Patient expressed understanding that any questions or concerns regarding his glomerulonephritis or his treatment should be directed towards nephrology.  Much of his laboratory work will also be monitored by them.  Proceed with cycle 1, day 15 today.  No further follow-up has been scheduled.  Please refer patient back if there is additional need for Rituxan or if you have any questions or concerns.    I spent a total of 30 minutes reviewing chart data, face-to-face evaluation with the patient, counseling and coordination of care as detailed above.   Patient expressed understanding and was in agreement with this plan. He also understands that He can call clinic at any time with any questions, concerns, or complaints.    Lloyd Huger, MD   09/18/2019 2:25 PM

## 2019-09-21 ENCOUNTER — Telehealth: Payer: Self-pay | Admitting: Oncology

## 2019-09-21 ENCOUNTER — Telehealth (INDEPENDENT_AMBULATORY_CARE_PROVIDER_SITE_OTHER): Payer: No Typology Code available for payment source | Admitting: Family Medicine

## 2019-09-21 DIAGNOSIS — J0140 Acute pansinusitis, unspecified: Secondary | ICD-10-CM | POA: Diagnosis not present

## 2019-09-21 DIAGNOSIS — M3214 Glomerular disease in systemic lupus erythematosus: Secondary | ICD-10-CM

## 2019-09-21 MED ORDER — LEVOFLOXACIN 500 MG PO TABS: 500.0000 mg | ORAL_TABLET | Freq: Every day | ORAL | 0 refills | Status: DC

## 2019-09-21 NOTE — Telephone Encounter (Signed)
Pt called to reschedule his Md\Rituxian treatment on Friday. He stated he has a upper respiratory infection and wanted to move his appt to 8/3. Pt has been rescheduled.

## 2019-09-21 NOTE — Telephone Encounter (Signed)
Error pt wanted to move the appts to 10/08/2019.

## 2019-09-21 NOTE — Progress Notes (Signed)
MyChart Video Visit Virtual Visit via Video Note  I connected with Timothy Best on 09/21/19 at  3:20 PM EDT by a video enabled telemedicine application and verified that I am speaking with the correct person using two identifiers.  Location: Patient: Musician Provider: Office   I discussed the limitations of evaluation and management by telemedicine and the availability of in person appointments. The patient expressed understanding and agreed to proceed.  History of Present Illness:    Observations/Objective:   Assessment and Plan:   Follow Up Instructions:    I discussed the assessment and treatment plan with the patient. The patient was provided an opportunity to ask questions and all were answered. The patient agreed with the plan and demonstrated an understanding of the instructions.   The patient was advised to call back or seek an in-person evaluation if the symptoms worsen or if the condition fails to improve as anticipated.  I provided 12 minutes of non-face-to-face time during this encounter.   Akash Winski Cranford Mon, MD     Virtual Visit via Video Note   This visit type was conducted due to national recommendations for restrictions regarding the COVID-19 Pandemic (e.g. social distancing) in an effort to limit this patient's exposure and mitigate transmission in our community. This patient is at least at moderate risk for complications without adequate follow up. This format is felt to be most appropriate for this patient at this time. Physical exam was limited by quality of the video and audio technology used for the visit.   Patient location: Car Provider location: Office   Patient: Timothy Best   DOB: [birth year]   44 y.o. Male  MRN: [id number] Visit Date: 09/21/2019  Today's healthcare provider: Wilhemena Durie, MD   Chief Complaint  Patient presents with  . URI   I,Timothy Best,acting as a scribe for [employer], MD.,have documented all  relevant documentation on the behalf of Wilhemena Durie, MD,as directed by  Wilhemena Durie, MD while in the presence of Wilhemena Durie, MD.  Subjective    URI  This is a new problem. Episode onset: 7 weeks ago  The problem has been unchanged. Maximum temperature: 99.6. Associated symptoms include congestion, coughing, headaches and a sore throat. Pertinent negatives include no rhinorrhea, sinus pain or sneezing. Chest pain: chest pressure  Associated symptoms comments: Fever and postnasal drainage . He has tried acetaminophen and NSAIDs (sudafed) for the symptoms. The treatment provided mild relief.    Patient has had both Covid vaccines and had a booster shot due to his chronic illnesses about a week ago. Patient has a history of membranous glomerulonephritis and systemic lupus.  He is followed by nephrology and rheumatology.   Past Medical History:  Diagnosis Date  . Dermatitis   . Erectile dysfunction   . Glomerulonephritis   . Hyperlipidemia   . Hypertension   . Pneumonia   . Proteinuria   . Systemic lupus (HCC)       Medications: Outpatient Medications Prior to Visit  Medication Sig  . atorvastatin (LIPITOR) 20 MG tablet Take 20 mg by mouth daily.  . Azelastine-Fluticasone 137-50 MCG/ACT SUSP Place 1 spray into both nostrils 2 (two) times daily.  . benazepril (LOTENSIN) 20 MG tablet Take 20-40 mg by mouth See admin instructions. Take 40 mg by mouth in the morning and 20 mg in the evening  . hydroxychloroquine (PLAQUENIL) 200 MG tablet Take 400 mg by mouth daily.  . Multiple Vitamin (  MULTIVITAMIN WITH MINERALS) TABS tablet Take 1 tablet by mouth daily.  . Nutritional Supplements (DHEA PO) Take 1 capsule by mouth daily.  Marland Kitchen torsemide (DEMADEX) 20 MG tablet Take 20 mg by mouth daily.    No facility-administered medications prior to visit.    Review of Systems  HENT: Positive for congestion and sore throat. Negative for rhinorrhea, sinus pain and sneezing.     Respiratory: Positive for cough.   Cardiovascular: Chest pain: chest pressure   Neurological: Positive for headaches.  No cough.     Objective    There were no vitals taken for this visit.    Physical Exam  Patient in no acute distress on the video call.  He is breathing easily and is in no pain.  He is speaking in full sentences with no problems at all speaking   Assessment & Plan     1. Subacute pansinusitis Due to immune compromise when treated with Levaquin for a week. I do not think the patient has Covid as he had 3 vaccines now. We will consider Covid testing if it does not improve but he is being very careful with what he does.  2. SLE glomerulonephritis syndrome (HCC)    No follow-ups on file.     I discussed the assessment and treatment plan with the patient. The patient was provided an opportunity to ask questions and all were answered. The patient agreed with the plan and demonstrated an understanding of the instructions.   The patient was advised to call back or seek an in-person evaluation if the symptoms worsen or if the condition fails to improve as anticipated.  I provided 12 minutes of non-face-to-face time during this encounter.    Shanessa Hodak Cranford Mon, MD [facility] [phone] (phone) [phone] (fax)  [place]

## 2019-09-24 ENCOUNTER — Inpatient Hospital Stay: Payer: No Typology Code available for payment source

## 2019-09-24 ENCOUNTER — Inpatient Hospital Stay: Payer: No Typology Code available for payment source | Admitting: Oncology

## 2019-10-02 NOTE — Progress Notes (Signed)
[place]  Telephone:([phone]) [phone] Fax:(336) [phone]  ID: ELDOR CONAWAY OB: 1975-05-05  MR#: [id number]  HCW#:[id number]  Patient Care Team: Margo Common, PA as PCP - General (Family Medicine) Telford Nab, RN as Registered Nurse  I connected with [name] on 10/08/19 at  8:30 AM EDT by video enabled telemedicine visit and verified that I am speaking with the correct person using two identifiers.   I discussed the limitations, risks, security and privacy concerns of performing an evaluation and management service by telemedicine and the availability of in-person appointments. I also discussed with the patient that there may be a patient responsible charge related to this service. The patient expressed understanding and agreed to proceed.   Other persons participating in the visit and their role in the encounter: Patient, MD.  Patient's location: Clinic. Provider's location: Home.  CHIEF COMPLAINT: SLE, glomerulonephritis.  INTERVAL HISTORY: Patient agreed to video assisted telemedicine visit for further evaluation and continuation of Ruxience for his glomerulonephritis.  He currently feels well and is asymptomatic. He has no neurologic complaints.  He denies any recent fevers or illnesses.  He has a good appetite and denies weight loss.  He has no chest pain, shortness of breath, cough, or hemoptysis.  He denies any nausea, vomiting, constipation, or diarrhea.  He has no urinary complaints.  Patient feels at his baseline and offers no specific complaints today.  REVIEW OF SYSTEMS:   Review of Systems  Constitutional: Negative.  Negative for fever, malaise/fatigue and weight loss.  Respiratory: Negative.  Negative for cough, hemoptysis and shortness of breath.   Cardiovascular: Negative.  Negative for chest pain and leg swelling.  Gastrointestinal: Negative.  Negative for abdominal pain.  Genitourinary: Negative.  Negative for dysuria.    Musculoskeletal: Negative.  Negative for back pain.  Skin: Negative.   Neurological: Negative.  Negative for dizziness, focal weakness, weakness and headaches.  Psychiatric/Behavioral: Negative.  The patient is not nervous/anxious.     As per HPI. Otherwise, a complete review of systems is negative.  PAST MEDICAL HISTORY: Past Medical History:  Diagnosis Date  . Dermatitis   . Erectile dysfunction   . Glomerulonephritis   . Hyperlipidemia   . Hypertension   . Pneumonia   . Proteinuria   . Systemic lupus ([place])     PAST SURGICAL HISTORY: Past Surgical History:  Procedure Laterality Date  . NO PAST SURGERIES    . RENAL BIOPSY    . THYROIDECTOMY Left 12/22/2018   Procedure: HEMI THYROIDECTOMY;  Surgeon: Carloyn Manner, MD;  Location: [facility] ORS;  Service: ENT;  Laterality: Left;    FAMILY HISTORY: Family History  Problem Relation Age of Onset  . Allergies Mother   . Clotting disorder Father   . Allergies Brother     ADVANCED DIRECTIVES (Y/N):  N  HEALTH MAINTENANCE: Social History   Tobacco Use  . Smoking status: Former Smoker    Packs/day: 1.00    Years: 12.00    Pack years: 12.00    Types: Cigarettes  . Smokeless tobacco: Never Used  Vaping Use  . Vaping Use: Never used  Substance Use Topics  . Alcohol use: Yes    Comment: occasionally   . Drug use: No     Colonoscopy:  PAP:  Bone density:  Lipid panel:  No Known Allergies  Current Outpatient Medications  Medication Sig Dispense Refill  . atorvastatin (LIPITOR) 20 MG tablet Take 20 mg by mouth daily.    . Azelastine-Fluticasone  137-50 MCG/ACT SUSP Place 1 spray into both nostrils 2 (two) times daily.    . benazepril (LOTENSIN) 20 MG tablet Take 20-40 mg by mouth See admin instructions. Take 40 mg by mouth in the morning and 20 mg in the evening    . hydroxychloroquine (PLAQUENIL) 200 MG tablet Take 400 mg by mouth daily.    Marland Kitchen levofloxacin (LEVAQUIN) 500 MG tablet Take 1 tablet (500 mg total) by  mouth daily. 7 tablet 0  . Multiple Vitamin (MULTIVITAMIN WITH MINERALS) TABS tablet Take 1 tablet by mouth daily.    . Nutritional Supplements (DHEA PO) Take 1 capsule by mouth daily.    Marland Kitchen torsemide (DEMADEX) 20 MG tablet Take 20 mg by mouth daily.      No current facility-administered medications for this visit.    OBJECTIVE: Vitals:   10/08/19 0843  BP: 115/78  Pulse: 71  Resp: 20  Temp: (!) 97.4 F (36.3 C)  SpO2: 100%     Body mass index is 28.28 kg/m.    ECOG FS:0 - Asymptomatic  General: Well-developed, well-nourished, no acute distress. HEENT: Normocephalic. Neuro: Alert, answering all questions appropriately. Cranial nerves grossly intact. Psych: Normal affect.   LAB RESULTS:  Lab Results  Component Value Date   NA 138 12/18/2018   K 3.8 12/18/2018   CL 103 12/18/2018   CO2 26 12/18/2018   GLUCOSE 99 12/18/2018   BUN 20 12/18/2018   CREATININE 0.97 12/18/2018   CALCIUM 8.4 (L) 12/18/2018   PROT 6.0 04/07/2018   ALBUMIN 3.3 (L) 04/07/2018   AST 20 04/07/2018   ALT 34 04/07/2018   ALKPHOS 91 04/07/2018   BILITOT <0.2 04/07/2018   GFRNONAA >60 12/18/2018   GFRAA >60 12/18/2018    Lab Results  Component Value Date   WBC 5.1 03/09/2019   NEUTROABS 2.8 03/09/2019   HGB 15.3 03/09/2019   HCT 46.6 03/09/2019   MCV 88.9 03/09/2019   PLT 276 03/09/2019     STUDIES: No results found.  ASSESSMENT: SLE, glomerulonephritis.  PLAN:    1. SLE, glomerulonephritis: Patient is currently not on any immunosuppressive medications.  He last received treatment with Ruxience in February 2021.  Plan is to repeat treatment with 1000 mg IV Ruxience on days 1 and 15.  Patient expressed understanding that any questions or concerns regarding his glomerulonephritis or his treatment should be directed towards nephrology.  Much of his laboratory work will also be monitored by them.  Proceed with cycle 2, day 1 of treatment today.  Return to clinic in 2 weeks for Ruxience  only.  No further follow-up has been scheduled at this time.  Please refer patient back if additional treatments are needed.    I provided 30 minutes of face-to-face video visit time during this encounter which included chart review, counseling, and coordination of care as documented above.    Patient expressed understanding and was in agreement with this plan. He also understands that He can call clinic at any time with any questions, concerns, or complaints.    Lloyd Huger, MD   10/08/2019 10:22 AM

## 2019-10-03 ENCOUNTER — Other Ambulatory Visit: Payer: Self-pay | Admitting: Oncology

## 2019-10-08 ENCOUNTER — Inpatient Hospital Stay: Payer: No Typology Code available for payment source

## 2019-10-08 ENCOUNTER — Inpatient Hospital Stay: Payer: No Typology Code available for payment source | Attending: Oncology | Admitting: Oncology

## 2019-10-08 ENCOUNTER — Other Ambulatory Visit: Payer: Self-pay

## 2019-10-08 VITALS — BP 127/76 | HR 80

## 2019-10-08 VITALS — BP 115/78 | HR 71 | Temp 97.4°F | Resp 20 | Wt 208.5 lb

## 2019-10-08 DIAGNOSIS — M3214 Glomerular disease in systemic lupus erythematosus: Secondary | ICD-10-CM

## 2019-10-08 DIAGNOSIS — Z5112 Encounter for antineoplastic immunotherapy: Secondary | ICD-10-CM | POA: Insufficient documentation

## 2019-10-08 MED ORDER — SODIUM CHLORIDE 0.9 % IV SOLN
1000.0000 mg | Freq: Once | INTRAVENOUS | Status: AC
  Administered 2019-10-08: 1000 mg via INTRAVENOUS
  Filled 2019-10-08: qty 100

## 2019-10-08 MED ORDER — ACETAMINOPHEN 325 MG PO TABS
650.0000 mg | ORAL_TABLET | Freq: Once | ORAL | Status: AC
  Administered 2019-10-08: 650 mg via ORAL
  Filled 2019-10-08: qty 2

## 2019-10-08 MED ORDER — SODIUM CHLORIDE 0.9 % IV SOLN
Freq: Once | INTRAVENOUS | Status: AC
  Filled 2019-10-08: qty 250

## 2019-10-08 MED ORDER — DIPHENHYDRAMINE HCL 25 MG PO CAPS
25.0000 mg | ORAL_CAPSULE | Freq: Once | ORAL | Status: AC
  Administered 2019-10-08: 25 mg via ORAL
  Filled 2019-10-08: qty 1

## 2019-10-08 NOTE — Progress Notes (Signed)
Patient here for follow up. Denies any pain or concerns at todays visit

## 2019-10-21 ENCOUNTER — Other Ambulatory Visit: Payer: Self-pay

## 2019-10-21 ENCOUNTER — Ambulatory Visit
Admission: RE | Admit: 2019-10-21 | Discharge: 2019-10-21 | Disposition: A | Discharge status: Home / Self Care | Payer: No Typology Code available for payment source | Attending: Physician Assistant | Admitting: Physician Assistant

## 2019-10-21 ENCOUNTER — Ambulatory Visit
Admission: RE | Admit: 2019-10-21 | Discharge: 2019-10-21 | Disposition: A | Discharge status: Home / Self Care | Payer: No Typology Code available for payment source | Source: Ambulatory Visit | Attending: Physician Assistant | Admitting: Physician Assistant

## 2019-10-21 ENCOUNTER — Ambulatory Visit (INDEPENDENT_AMBULATORY_CARE_PROVIDER_SITE_OTHER): Payer: No Typology Code available for payment source | Admitting: Physician Assistant

## 2019-10-21 DIAGNOSIS — M79675 Pain in left toe(s): Secondary | ICD-10-CM | POA: Insufficient documentation

## 2019-10-21 MED ORDER — PREDNISONE 20 MG PO TABS: 40.0000 mg | ORAL_TABLET | Freq: Every day | ORAL | 0 refills | Status: AC

## 2019-10-21 NOTE — Progress Notes (Signed)
Established patient visit   Patient: Timothy Best   DOB: 20-Nov-1975   44 y.o. Male  MRN: [id number] Visit Date: 10/21/2019  Today's healthcare provider: Trinna Post, PA-C   Chief Complaint  Patient presents with  . Toe Pain   Subjective    Toe Pain  The incident occurred more than 1 week ago. There was no injury mechanism. The pain is present in the left toes and right toes. The quality of the pain is described as stabbing, aching and burning. The pain is at a severity of 7/10. The pain is severe. Associated symptoms include an inability to bear weight. Pertinent negatives include no numbness or tingling. He reports no foreign bodies present. The symptoms are aggravated by movement and weight bearing. He has tried NSAIDs and non-weight bearing for the symptoms. The treatment provided mild relief.         Medications: Outpatient Medications Prior to Visit  Medication Sig  . atorvastatin (LIPITOR) 20 MG tablet Take 20 mg by mouth daily.  . Azelastine-Fluticasone 137-50 MCG/ACT SUSP Place 1 spray into both nostrils 2 (two) times daily.  . benazepril (LOTENSIN) 20 MG tablet Take 20-40 mg by mouth See admin instructions. Take 40 mg by mouth in the morning and 20 mg in the evening  . hydroxychloroquine (PLAQUENIL) 200 MG tablet Take 400 mg by mouth daily.  Marland Kitchen levofloxacin (LEVAQUIN) 500 MG tablet Take 1 tablet (500 mg total) by mouth daily.  . Multiple Vitamin (MULTIVITAMIN WITH MINERALS) TABS tablet Take 1 tablet by mouth daily.  . Nutritional Supplements (DHEA PO) Take 1 capsule by mouth daily.  Marland Kitchen torsemide (DEMADEX) 20 MG tablet Take 20 mg by mouth daily.    No facility-administered medications prior to visit.    Review of Systems  Constitutional: Negative.   Respiratory: Negative.   Cardiovascular: Negative.   Neurological: Negative for tingling and numbness.      Objective    There were no vitals taken for this visit.   Physical Exam Constitutional:       Appearance: Normal appearance. He is normal weight.  Cardiovascular:     Rate and Rhythm: Normal rate.  Pulmonary:     Effort: Pulmonary effort is normal.  Feet:     Comments: Perhaps some mild swelling of left great toe but no prominent swelling, mass or erythema. Toe is not tender to light palpation.  Neurological:     Mental Status: He is alert.  Psychiatric:        Mood and Affect: Mood normal.        Behavior: Behavior normal.       No results found for any visits on 10/21/19.  Assessment & Plan    1. Pain of toe of left foot  Suspect arthritis as the presentation is very atypical for gout. Will treat with prednisone as patient must avoid NSAIDs due to kidney disease.   - DG Foot Complete Left; Future - predniSONE (DELTASONE) 20 MG tablet; Take 2 tablets (40 mg total) by mouth daily with breakfast for 5 days.  Dispense: 10 tablet; Refill: 0    No follow-ups on file.      ITrinna Post, PA-C, have reviewed all documentation for this visit. The documentation on 11/04/19 for the exam, diagnosis, procedures, and orders are all accurate and complete.  The entirety of the information documented in the History of Present Illness, Review of Systems and Physical Exam were personally obtained by me. Portions of  this information were initially documented by [facility] and reviewed by me for thoroughness and accuracy.     Paulene Floor  [facility] [phone] (phone) [phone] (fax)  [place]

## 2019-10-21 NOTE — Patient Instructions (Signed)
 Gout  Gout is painful swelling of your joints. Gout is a type of arthritis. It is caused by having too much uric acid in your body. Uric acid is a chemical that is made when your body breaks down substances called purines. If your body has too much uric acid, sharp crystals can form and build up in your joints. This causes pain and swelling. Gout attacks can happen quickly and be very painful (acute gout). Over time, the attacks can affect more joints and happen more often (chronic gout). What are the causes?  Too much uric acid in your blood. This can happen because: ? Your kidneys do not remove enough uric acid from your blood. ? Your body makes too much uric acid. ? You eat too many foods that are high in purines. These foods include organ meats, some seafood, and beer.  Trauma or stress. What increases the risk?  Having a family history of gout.  Being male and middle-aged.  Being male and having gone through menopause.  Being very overweight (obese).  Drinking alcohol, especially beer.  Not having enough water in the body (being dehydrated).  Losing weight too quickly.  Having an organ transplant.  Having lead poisoning.  Taking certain medicines.  Having kidney disease.  Having a skin condition called psoriasis. What are the signs or symptoms? An attack of acute gout usually happens in just one joint. The most common place is the big toe. Attacks often start at night. Other joints that may be affected include joints of the feet, ankle, knee, fingers, wrist, or elbow. Symptoms of an attack may include:  Very bad pain.  Warmth.  Swelling.  Stiffness.  Shiny, red, or purple skin.  Tenderness. The affected joint may be very painful to touch.  Chills and fever. Chronic gout may cause symptoms more often. More joints may be involved. You may also have white or yellow lumps (tophi) on your hands or feet or in other areas near your joints. How is this  treated?  Treatment for this condition has two phases: treating an acute attack and preventing future attacks.  Acute gout treatment may include: ? NSAIDs. ? Steroids. These are taken by mouth or injected into a joint. ? Colchicine. This medicine relieves pain and swelling. It can be given by mouth or through an IV tube.  Preventive treatment may include: ? Taking small doses of NSAIDs or colchicine daily. ? Using a medicine that reduces uric acid levels in your blood. ? Making changes to your diet. You may need to see a food expert (dietitian) about what to eat and drink to prevent gout. Follow these instructions at home: During a gout attack   If told, put ice on the painful area: ? Put ice in a plastic bag. ? Place a towel between your skin and the bag. ? Leave the ice on for 20 minutes, 2-3 times a day.  Raise (elevate) the painful joint above the level of your heart as often as you can.  Rest the joint as much as possible. If the joint is in your leg, you may be given crutches.  Follow instructions from your doctor about what you cannot eat or drink. Avoiding future gout attacks  Eat a low-purine diet. Avoid foods and drinks such as: ? Liver. ? Kidney. ? Anchovies. ? Asparagus. ? Herring. ? Mushrooms. ? Mussels. ? Beer.  Stay at a healthy weight. If you want to lose weight, talk with your doctor. Do not lose   weight too fast.  Start or continue an exercise plan as told by your doctor. Eating and drinking  Drink enough fluids to keep your pee (urine) pale yellow.  If you drink alcohol: ? Limit how much you use to:  0-1 drink a day for women.  0-2 drinks a day for men. ? Be aware of how much alcohol is in your drink. In the [place] one drink equals one 12 oz bottle of beer (355 mL), one 5 oz glass of wine (148 mL), or one 1 oz glass of hard liquor (44 mL). General instructions  Take over-the-counter and prescription medicines only as told by your doctor.  Do  not drive or use heavy machinery while taking prescription pain medicine.  Return to your normal activities as told by your doctor. Ask your doctor what activities are safe for you.  Keep all follow-up visits as told by your doctor. This is important. Contact a doctor if:  You have another gout attack.  You still have symptoms of a gout attack after 10 days of treatment.  You have problems (side effects) because of your medicines.  You have chills or a fever.  You have burning pain when you pee (urinate).  You have pain in your lower back or belly. Get help right away if:  You have very bad pain.  Your pain cannot be controlled.  You cannot pee. Summary  Gout is painful swelling of the joints.  The most common site of pain is the big toe, but it can affect other joints.  Medicines and avoiding some foods can help to prevent and treat gout attacks. This information is not intended to replace advice given to you by your health care provider. Make sure you discuss any questions you have with your health care provider. Document Revised: 08/13/2017 Document Reviewed: 08/13/2017 Elsevier Patient Education  2020 [employer] Inc.  

## 2019-10-22 ENCOUNTER — Inpatient Hospital Stay: Payer: No Typology Code available for payment source

## 2019-10-22 ENCOUNTER — Encounter: Payer: Self-pay | Admitting: Physician Assistant

## 2019-10-22 VITALS — BP 117/76 | HR 78 | Temp 98.7°F | Resp 16

## 2019-10-22 DIAGNOSIS — M3214 Glomerular disease in systemic lupus erythematosus: Secondary | ICD-10-CM

## 2019-10-22 DIAGNOSIS — Z5112 Encounter for antineoplastic immunotherapy: Secondary | ICD-10-CM | POA: Diagnosis not present

## 2019-10-22 MED ORDER — ACETAMINOPHEN 325 MG PO TABS
650.0000 mg | ORAL_TABLET | Freq: Once | ORAL | Status: AC
  Administered 2019-10-22: 650 mg via ORAL
  Filled 2019-10-22: qty 2

## 2019-10-22 MED ORDER — SODIUM CHLORIDE 0.9 % IV SOLN
Freq: Once | INTRAVENOUS | Status: AC
  Filled 2019-10-22: qty 250

## 2019-10-22 MED ORDER — SODIUM CHLORIDE 0.9 % IV SOLN
1000.0000 mg | Freq: Once | INTRAVENOUS | Status: AC
  Administered 2019-10-22: 1000 mg via INTRAVENOUS
  Filled 2019-10-22: qty 100

## 2019-10-22 MED ORDER — DIPHENHYDRAMINE HCL 25 MG PO CAPS
25.0000 mg | ORAL_CAPSULE | Freq: Once | ORAL | Status: AC
  Administered 2019-10-22: 25 mg via ORAL
  Filled 2019-10-22: qty 1

## 2019-10-29 ENCOUNTER — Encounter: Payer: Self-pay | Admitting: Physician Assistant

## 2019-10-29 DIAGNOSIS — M19079 Primary osteoarthritis, unspecified ankle and foot: Secondary | ICD-10-CM

## 2020-02-08 ENCOUNTER — Ambulatory Visit: Payer: Self-pay | Admitting: *Deleted

## 2020-02-08 NOTE — Telephone Encounter (Signed)
Pt called in c/o having a sore throat, muscle aches and chills that he woke up with at 2 AM this morning.  Yesterday he had a scratchy throat.   His wife has been sick with a sinus infection last week and was put on abx and prednisone.   She is doing much better.  He and his wife tested negative for covid on their home tests.  He tested last night when he woke up at 2 AM and was negative. He has had both covid vaccines and a booster.  He has had infusions of Rituxin so he is immunocompromised.     There are no openings at [facility] today or tomorrow so I have referred him to the urgent care.  He was agreeable to this plan and is going to the [employer] in clinic.    Reason for Disposition . [1] COVID-19 infection suspected by caller or triager AND [2] mild symptoms (cough, fever, or others) AND [3] negative COVID-19 rapid test    Has immunosuppression due to Rituxin infusions.  Answer Assessment - Initial Assessment Questions 1. COVID-19 DIAGNOSIS: "Who made your COVID-19 diagnosis?" "Was it confirmed by a positive lab test?" If not diagnosed by a HCP, ask "Are there lots of cases (community spread) where you live?" Note: See public health department website, if unsure.     I tested myself at 3 AM and my covid test was negative.  I feel like I have strep throat. I'm immunosuppressed. 2. COVID-19 EXPOSURE: "Was there any known exposure to COVID before the symptoms began?" CDC Definition of close contact: within 6 feet (2 meters) for a total of 15 minutes or more over a 24-hour period.      No covid exposure.   My wife had a sinus infection last week and a sore throat.  Abx and prednisone given to wife last week.   Wife is negative also. We spent time with family. 3. ONSET: "When did the COVID-19 symptoms start?"      The sore throat started yesterday.   I woke up at 2:00 AM and it was very sore.   I'm having a lot of drainage down my throat.  My temperature is normal but I'm  having chills hot and cold on and off. 4. WORST SYMPTOM: "What is your worst symptom?" (e.g., cough, fever, shortness of breath, muscle aches)     No coughing. 5. COUGH: "Do you have a cough?" If Yes, ask: "How bad is the cough?"       No 6. FEVER: "Do you have a fever?" If Yes, ask: "What is your temperature, how was it measured, and when did it start?"     I feel like it but it's normal 7. RESPIRATORY STATUS: "Describe your breathing?" (e.g., shortness of breath, wheezing, unable to speak)      No respiratory issues 8. BETTER-SAME-WORSE: "Are you getting better, staying the same or getting worse compared to yesterday?"  If getting worse, ask, "In what way?"     I have a runny nose.    9. HIGH RISK DISEASE: "Do you have any chronic medical problems?" (e.g., asthma, heart or lung disease, weak immune system, obesity, etc.)     I'm immunocomprimised.   I've had Rituxin infusions. 10. VACCINE: "Have you gotten the COVID-19 vaccine?" If Yes ask: "Which one, how many shots, when did you get it?"       Yes both vaccinations and the booster. 11. PREGNANCY: "Is there any chance  you are pregnant?" "When was your last menstrual period?"       N/A 12. OTHER SYMPTOMS: "Do you have any other symptoms?"  (e.g., chills, fatigue, headache, loss of smell or taste, muscle pain, sore throat; new loss of smell or taste especially support the diagnosis of COVID-19)       Muscle weakness yesterday and sore throat.  Protocols used: CORONAVIRUS (COVID-19) DIAGNOSED OR SUSPECTED-A-AH

## 2020-02-10 ENCOUNTER — Telehealth: Payer: No Typology Code available for payment source | Admitting: Family Medicine

## 2020-02-28 DIAGNOSIS — I10 Essential (primary) hypertension: Secondary | ICD-10-CM | POA: Diagnosis not present

## 2020-02-28 DIAGNOSIS — Z09 Encounter for follow-up examination after completed treatment for conditions other than malignant neoplasm: Secondary | ICD-10-CM | POA: Diagnosis not present

## 2020-02-28 DIAGNOSIS — N032 Chronic nephritic syndrome with diffuse membranous glomerulonephritis: Secondary | ICD-10-CM | POA: Diagnosis not present

## 2020-02-28 DIAGNOSIS — E785 Hyperlipidemia, unspecified: Secondary | ICD-10-CM | POA: Diagnosis not present

## 2020-03-03 DIAGNOSIS — R0683 Snoring: Secondary | ICD-10-CM | POA: Diagnosis not present

## 2020-03-03 DIAGNOSIS — E890 Postprocedural hypothyroidism: Secondary | ICD-10-CM | POA: Diagnosis not present

## 2020-04-03 ENCOUNTER — Encounter: Payer: Self-pay | Admitting: Family Medicine

## 2020-04-12 DIAGNOSIS — N032 Chronic nephritic syndrome with diffuse membranous glomerulonephritis: Secondary | ICD-10-CM | POA: Diagnosis not present

## 2020-04-13 ENCOUNTER — Ambulatory Visit: Payer: No Typology Code available for payment source | Admitting: Physician Assistant

## 2020-04-17 DIAGNOSIS — R801 Persistent proteinuria, unspecified: Secondary | ICD-10-CM | POA: Diagnosis not present

## 2020-05-09 ENCOUNTER — Telehealth: Payer: Self-pay | Admitting: Emergency Medicine

## 2020-05-09 DIAGNOSIS — J069 Acute upper respiratory infection, unspecified: Secondary | ICD-10-CM

## 2020-05-09 MED ORDER — BENZONATATE 100 MG PO CAPS: 100.0000 mg | ORAL_CAPSULE | Freq: Two times a day (BID) | ORAL | 0 refills | Status: DC | PRN

## 2020-05-09 NOTE — Progress Notes (Signed)
We are sorry you are not feeling well.  Here is how we plan to help!  Based on what you have shared with me, it looks like you may have a viral upper respiratory infection.  Upper respiratory infections are caused by a large number of viruses; however, rhinovirus is the most common cause.   Symptoms vary from person to person, with common symptoms including sore throat, cough, fatigue or lack of energy and feeling of general discomfort.  A low-grade fever of up to 100.4 may present, but is often uncommon.  Symptoms vary however, and are closely related to a person's age or underlying illnesses.  The most common symptoms associated with an upper respiratory infection are nasal discharge or congestion, cough, sneezing, headache and pressure in the ears and face.  These symptoms usually persist for about 3 to 10 days, but can last up to 2 weeks.  It is important to know that upper respiratory infections do not cause serious illness or complications in most cases.    Upper respiratory infections can be transmitted from person to person, with the most common method of transmission being a person's hands.  The virus is able to live on the skin and can infect other persons for up to 2 hours after direct contact.  Also, these can be transmitted when someone coughs or sneezes; thus, it is important to cover the mouth to reduce this risk.  To keep the spread of the illness at [place], good hand hygiene is very important.  This is an infection that is most likely caused by a virus. There are no specific treatments other than to help you with the symptoms until the infection runs its course.  We are sorry you are not feeling well.  Here is how we plan to help!   For nasal congestion, you may use an oral decongestants such as Mucinex D or if you have glaucoma or high blood pressure use plain Mucinex.  Saline nasal spray or nasal drops can help and can safely be used as often as needed for congestion.  For your congestion,  continue to use your azelastine-fluticasone nasal spray and try sinus rinses.  If you do not have a history of heart disease, hypertension, diabetes or thyroid disease, prostate/bladder issues or glaucoma, you may also use Sudafed to treat nasal congestion.  It is highly recommended that you consult with a pharmacist or your primary care physician to ensure this medication is safe for you to take.     If you have a cough, you may use cough suppressants such as Delsym and Robitussin.  If you have glaucoma or high blood pressure, you can also use Coricidin HBP.   For cough I have prescribed for you A prescription cough medication called Tessalon Perles 100 mg. You may take 1-2 capsules every 8 hours as needed for cough  If you have a sore or scratchy throat, use a saltwater gargle-  to  teaspoon of salt dissolved in a 4-ounce to 8-ounce glass of warm water.  Gargle the solution for approximately 15-30 seconds and then spit.  It is important not to swallow the solution.  You can also use throat lozenges/cough drops and Chloraseptic spray to help with throat pain or discomfort.  Warm or cold liquids can also be helpful in relieving throat pain.  For headache, pain or general discomfort, you can use Ibuprofen or Tylenol as directed.   Some authorities believe that zinc sprays or the use of Echinacea may shorten  the course of your symptoms.   HOME CARE . Only take medications as instructed by your medical team. . Be sure to drink plenty of fluids. Water is fine as well as fruit juices, sodas and electrolyte beverages. You may want to stay away from caffeine or alcohol. If you are nauseated, try taking small sips of liquids. How do you know if you are getting enough fluid? Your urine should be a pale yellow or almost colorless. . Get rest. . Taking a steamy shower or using a humidifier may help nasal congestion and ease sore throat pain. You can place a towel over your head and breathe in the steam from  hot water coming from a faucet. . Using a saline nasal spray works much the same way. . Cough drops, hard candies and sore throat lozenges may ease your cough. . Avoid close contacts especially the very young and the elderly . Cover your mouth if you cough or sneeze . Always remember to wash your hands.   GET HELP RIGHT AWAY IF: . You develop worsening fever. . If your symptoms do not improve within 10 days . You develop yellow or green discharge from your nose over 3 days. . You have coughing fits . You develop a severe head ache or visual changes. . You develop shortness of breath, difficulty breathing or start having chest pain . Your symptoms persist after you have completed your treatment plan  MAKE SURE YOU   Understand these instructions.  Will watch your condition.  Will get help right away if you are not doing well or get worse.  Your e-visit answers were reviewed by a board certified advanced clinical practitioner to complete your personal care plan. Depending upon the condition, your plan could have included both over the counter or prescription medications. Please review your pharmacy choice. If there is a problem, you may call our nursing hot line at and have the prescription routed to another pharmacy. Your safety is important to [place]. If you have drug allergies check your prescription carefully.   You can use MyChart to ask questions about today's visit, request a non-urgent call back, or ask for a work or school excuse for 24 hours related to this e-Visit. If it has been greater than 24 hours you will need to follow up with your provider, or enter a new e-Visit to address those concerns. You will get an e-mail in the next two days asking about your experience.  I hope that your e-visit has been valuable and will speed your recovery. Thank you for using e-visits.    Approximately 5 minutes was spent documenting and reviewing patient's chart.

## 2020-06-12 DIAGNOSIS — E785 Hyperlipidemia, unspecified: Secondary | ICD-10-CM | POA: Diagnosis not present

## 2020-06-12 DIAGNOSIS — R801 Persistent proteinuria, unspecified: Secondary | ICD-10-CM | POA: Diagnosis not present

## 2020-06-12 DIAGNOSIS — I10 Essential (primary) hypertension: Secondary | ICD-10-CM | POA: Diagnosis not present

## 2020-06-12 DIAGNOSIS — N032 Chronic nephritic syndrome with diffuse membranous glomerulonephritis: Secondary | ICD-10-CM | POA: Diagnosis not present

## 2020-09-11 DIAGNOSIS — I10 Essential (primary) hypertension: Secondary | ICD-10-CM | POA: Diagnosis not present

## 2020-09-11 DIAGNOSIS — R801 Persistent proteinuria, unspecified: Secondary | ICD-10-CM | POA: Diagnosis not present

## 2020-09-11 DIAGNOSIS — E785 Hyperlipidemia, unspecified: Secondary | ICD-10-CM | POA: Diagnosis not present

## 2020-09-11 DIAGNOSIS — N032 Chronic nephritic syndrome with diffuse membranous glomerulonephritis: Secondary | ICD-10-CM | POA: Diagnosis not present

## 2020-09-26 DIAGNOSIS — N032 Chronic nephritic syndrome with diffuse membranous glomerulonephritis: Secondary | ICD-10-CM | POA: Diagnosis not present

## 2020-09-26 DIAGNOSIS — R801 Persistent proteinuria, unspecified: Secondary | ICD-10-CM | POA: Diagnosis not present

## 2020-09-26 DIAGNOSIS — I10 Essential (primary) hypertension: Secondary | ICD-10-CM | POA: Diagnosis not present

## 2020-09-26 DIAGNOSIS — R861 Abnormal level of hormones in specimens from male genital organs: Secondary | ICD-10-CM | POA: Diagnosis not present

## 2020-12-15 ENCOUNTER — Encounter: Payer: Self-pay | Admitting: Oncology

## 2020-12-15 ENCOUNTER — Telehealth: Payer: BC Managed Care – PPO | Admitting: Emergency Medicine

## 2020-12-15 DIAGNOSIS — G43109 Migraine with aura, not intractable, without status migrainosus: Secondary | ICD-10-CM

## 2020-12-15 MED ORDER — ELETRIPTAN HYDROBROMIDE 20 MG PO TABS: 20.0000 mg | ORAL_TABLET | ORAL | 2 refills | Status: DC | PRN

## 2020-12-15 MED ORDER — ONDANSETRON 4 MG PO TBDP: 4.0000 mg | ORAL_TABLET | Freq: Three times a day (TID) | ORAL | 0 refills | Status: DC | PRN

## 2020-12-15 NOTE — Patient Instructions (Signed)
Timothy Best, thank you for joining Carvel Getting, NP for today's virtual visit.  While this provider is not your primary care provider (PCP), if your PCP is located in our provider database this encounter information will be shared with them immediately following your visit.  Consent: (Patient) Timothy Best provided verbal consent for this virtual visit at the beginning of the encounter.  Current Medications:  Current Outpatient Medications:    eletriptan (RELPAX) 20 MG tablet, Take 1 tablet (20 mg total) by mouth as needed for migraine or headache. May repeat in 2 hours if headache persists or recurs., Disp: 10 tablet, Rfl: 2   ondansetron (ZOFRAN ODT) 4 MG disintegrating tablet, Take 1 tablet (4 mg total) by mouth every 8 (eight) hours as needed for nausea or vomiting (migraine)., Disp: 20 tablet, Rfl: 0   atorvastatin (LIPITOR) 20 MG tablet, Take 20 mg by mouth daily., Disp: , Rfl:    Azelastine-Fluticasone 137-50 MCG/ACT SUSP, Place 1 spray into both nostrils 2 (two) times daily., Disp: , Rfl:    benazepril (LOTENSIN) 20 MG tablet, Take 20-40 mg by mouth See admin instructions. Take 40 mg by mouth in the morning and 20 mg in the evening, Disp: , Rfl:    benzonatate (TESSALON) 100 MG capsule, Take 1-2 capsules (100-200 mg total) by mouth 2 (two) times daily as needed for cough., Disp: 20 capsule, Rfl: 0   hydroxychloroquine (PLAQUENIL) 200 MG tablet, Take 400 mg by mouth daily., Disp: , Rfl:    levofloxacin (LEVAQUIN) 500 MG tablet, Take 1 tablet (500 mg total) by mouth daily., Disp: 7 tablet, Rfl: 0   Multiple Vitamin (MULTIVITAMIN WITH MINERALS) TABS tablet, Take 1 tablet by mouth daily., Disp: , Rfl:    Nutritional Supplements (DHEA PO), Take 1 capsule by mouth daily., Disp: , Rfl:    torsemide (DEMADEX) 20 MG tablet, Take 20 mg by mouth daily. , Disp: , Rfl:    Medications ordered in this encounter:  Meds ordered this encounter  Medications   eletriptan (RELPAX) 20 MG tablet     Sig: Take 1 tablet (20 mg total) by mouth as needed for migraine or headache. May repeat in 2 hours if headache persists or recurs.    Dispense:  10 tablet    Refill:  2   ondansetron (ZOFRAN ODT) 4 MG disintegrating tablet    Sig: Take 1 tablet (4 mg total) by mouth every 8 (eight) hours as needed for nausea or vomiting (migraine).    Dispense:  20 tablet    Refill:  0     *If you need refills on other medications prior to your next appointment, please contact your pharmacy*  Follow-Up: Call back or seek an in-person evaluation if the symptoms worsen or if the condition fails to improve as anticipated.  Other Instructions Take the eletriptan AS SOON AS you feel a headache coming. If needed, you can take a 2nd dose 2 hours later. You can take the nausea medicine ondanestron with it if needed for nausea. If this treatment plan doesn't work and your headache is severe, you can take a small dose of ibuprofen if it's ok with your kidney doctor. The dose of this medicine can be in\creased a little if it's not quite helping enough at this initial dose.   Keep a headache diary to try and figure out what might be triggering your headaches. It can be stress but there are other triggers such as certain foods.    If  you have been instructed to have an in-person evaluation today at a local Urgent Care facility, please use the link below. It will take you to a list of all of our available [facility], including address, phone number and hours of operation. Please do not delay care.  [facility]  If you or a family member do not have a primary care provider, use the link below to schedule a visit and establish care. When you choose a [facility] primary care physician or advanced practice provider, you gain a long-term partner in health. Find a Primary Care Provider  Learn more about [facility] in-office and virtual care options: [facility]

## 2020-12-15 NOTE — Progress Notes (Signed)
Virtual Visit Consent   Timothy Best, you are scheduled for a virtual visit with a [facility] provider today.     Just as with appointments in the office, your consent must be obtained to participate.  Your consent will be active for this visit and any virtual visit you may have with one of our providers in the next 365 days.     If you have a MyChart account, a copy of this consent can be sent to you electronically.  All virtual visits are billed to your insurance company just like a traditional visit in the office.    As this is a virtual visit, video technology does not allow for your provider to perform a traditional examination.  This may limit your provider's ability to fully assess your condition.  If your provider identifies any concerns that need to be evaluated in person or the need to arrange testing (such as labs, EKG, etc.), we will make arrangements to do so.     Although advances in technology are sophisticated, we cannot ensure that it will always work on either your end or our end.  If the connection with a video visit is poor, the visit may have to be switched to a telephone visit.  With either a video or telephone visit, we are not always able to ensure that we have a secure connection.     I need to obtain your verbal consent now.   Are you willing to proceed with your visit today?    [name] has provided verbal consent on 12/15/2020 for a virtual visit (video or telephone).   Carvel Getting, NP   Date: 12/15/2020 10:56 AM   Virtual Visit via Video Note   I, Carvel Getting, connected with  Timothy Best  ([id number], [birth year]) on 12/15/20 at 10:30 AM EST by a video-enabled telemedicine application and verified that I am speaking with the correct person using two identifiers.  Location: Patient: Virtual Visit Location Patient: Home Provider: Virtual Visit Location Provider: Home Office   I discussed the limitations of evaluation and management by  telemedicine and the availability of in person appointments. The patient expressed understanding and agreed to proceed.    History of Present Illness: Timothy Best is a 45 y.o. who identifies as a male who was assigned male at birth, and is being seen today for recurrent headaches. Pt has had similar headaches in the past but since August, they have been occurring more frequently up to every 1 to 2 weeks.  Headaches are always one-sided, either the left or the right.  He thinks they occur more commonly on the right than the left, but he is not sure.  Pain is on the top of his head in the parietal area on 1 side or the other, and he also feels it affect the eye on that same side.  His eye is generally watery when he has a headache and sometimes he feels visual changes.  He can feel when the migraine is coming on.  He he feels nauseated with headaches but has not had any vomiting.  He also feels congested when he has a headache.  He was exposed to lead last August but had his lead level checked recently and was told it was okay.  Typically does not do anything to treat headaches except to use cold ice packs or heat to try to relieve it.  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  This morning he had a headache that woke him up at 530 and was severe enough that he took 800 mg of ibuprofen and some Sudafed.  This treatment dramatically improved his headache today.  He reports he is under increased stress lately as he is moving, has a young child, and started a new job recently.   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 May repeat in 2 hours if headache persists or recurs., Disp: 10 tablet, Rfl: 2   ondansetron (ZOFRAN ODT) 4 MG disintegrating tablet, Take 1 tablet (4 mg total) by mouth every 8 (eight) hours as needed for nausea or vomiting (migraine)., Disp: 20 tablet, Rfl: 0   atorvastatin (LIPITOR) 20 MG tablet, Take 20 mg by mouth daily., Disp: , Rfl:    Azelastine-Fluticasone 137-50 MCG/ACT SUSP, Place 1 spray into both nostrils 2 (two) times daily., Disp: , Rfl:    benazepril (LOTENSIN) 20 MG tablet, Take 20-40 mg by mouth See admin instructions. Take 40 mg by mouth in the morning and 20 mg in the evening, Disp: , Rfl:    benzonatate (TESSALON) 100 MG capsule, Take 1-2 capsules (100-200 mg total) by mouth 2 (two) times daily as needed for cough., Disp: 20 capsule, Rfl: 0   hydroxychloroquine (PLAQUENIL) 200 MG tablet, Take 400 mg by mouth daily., Disp: , Rfl:    levofloxacin (LEVAQUIN) 500 MG tablet, Take 1 tablet (500 mg total) by mouth daily., Disp: 7 tablet, Rfl: 0   Multiple Vitamin (MULTIVITAMIN WITH MINERALS) TABS tablet, Take 1 tablet by mouth daily., Disp: , Rfl:    Nutritional Supplements (DHEA PO), Take 1 capsule by mouth daily., Disp: , Rfl:    torsemide (DEMADEX) 20 MG tablet, Take 20 mg by mouth daily. , Disp: , Rfl:   Observations/Objective: Patient is well-developed, well-nourished in no acute distress.  Resting comfortably  at  home.  Head is normocephalic, atraumatic.  No labored breathing.  Speech is clear and coherent with logical content.  Patient is alert and oriented at baseline.    Assessment and Plan: 1. Migraine with aura and without status migrainosus, not intractable  Based on symptom description, I feel patient is having migraines.  Triptans are not contraindicated in patients with kidney disease.  I have prescribed Ella triptan at the lower dose to start with.  I have also prescribed ondansetron to use with headaches for nausea.  I asked patient to keep a headache diary to keep track of his symptoms and try to identify triggers.  Follow Up Instructions: I discussed the assessment and treatment plan with the patient. The patient was provided an opportunity to ask  questions and all were answered. The patient agreed with the plan and demonstrated an understanding of the instructions.  A copy of instructions were sent to the patient via MyChart unless otherwise noted below.   The patient was advised to call back or seek an in-person evaluation if the symptoms worsen or if the condition fails to improve as anticipated.  Time:  I spent 15 minutes with the patient via telehealth technology discussing the above problems/concerns.    Carvel Getting, NP

## 2021-01-02 DIAGNOSIS — R801 Persistent proteinuria, unspecified: Secondary | ICD-10-CM | POA: Diagnosis not present

## 2021-01-02 DIAGNOSIS — Z09 Encounter for follow-up examination after completed treatment for conditions other than malignant neoplasm: Secondary | ICD-10-CM | POA: Diagnosis not present

## 2021-01-02 DIAGNOSIS — I10 Essential (primary) hypertension: Secondary | ICD-10-CM | POA: Diagnosis not present

## 2021-01-02 DIAGNOSIS — N032 Chronic nephritic syndrome with diffuse membranous glomerulonephritis: Secondary | ICD-10-CM | POA: Diagnosis not present

## 2021-04-30 LAB — FECAL OCCULT BLOOD, IMMUNOCHEMICAL: IFOBT: NEGATIVE

## 2021-06-25 ENCOUNTER — Encounter: Payer: Self-pay | Admitting: Oncology

## 2021-06-28 ENCOUNTER — Ambulatory Visit: Payer: 59 | Admitting: Family Medicine

## 2021-06-28 NOTE — Progress Notes (Signed)
I,Tiffany J Bragg,acting as a Education administrator for [employer], PA-C.,have documented all relevant documentation on the behalf of Mikey Kirschner, PA-C,as directed by  Mikey Kirschner, PA-C while in the presence of Mikey Kirschner, PA-C.  Established patient visit   Patient: Timothy Best   DOB: [birth year]   46 y.o. Male  MRN: [id number] Visit Date: 06/29/2021  Today's healthcare provider: Mikey Kirschner, PA-C   Cc. EKG  Subjective    HPI HPI   Patient states he is being evaluated by a psychiatrist for ADHD and they are requesting an EKG be done.  Last edited by Smitty Knudsen, CMA on 06/29/2021 10:37 AM.      Jumaane is a 46 y/o male who presents today for an EKG requested by his psychiatrist before starting a stimulant medication. Pt reports a previously abnormal EKG but denies symptoms of chest pain, SOB, palpitations, dizziness. Sees Dr Hulda Marin who is with [facility].  Medications: Outpatient Medications Prior to Visit  Medication Sig   atorvastatin (LIPITOR) 20 MG tablet Take 20 mg by mouth daily.   eletriptan (RELPAX) 20 MG tablet Take 1 tablet (20 mg total) by mouth as needed for migraine or headache. May repeat in 2 hours if headache persists or recurs.   hydroxychloroquine (PLAQUENIL) 200 MG tablet Take 400 mg by mouth daily.   mycophenolate (CELLCEPT) 500 MG tablet Take 1,000 mg by mouth 2 (two) times daily.   Nutritional Supplements (DHEA PO) Take 1 capsule by mouth daily.   ondansetron (ZOFRAN ODT) 4 MG disintegrating tablet Take 1 tablet (4 mg total) by mouth every 8 (eight) hours as needed for nausea or vomiting (migraine).   sertraline (ZOLOFT) 50 MG tablet Take 50 mg by mouth daily.   telmisartan (MICARDIS) 40 MG tablet Take 40 mg by mouth daily.   [DISCONTINUED] Azelastine-Fluticasone 137-50 MCG/ACT SUSP Place 1 spray into both nostrils 2 (two) times daily.   [DISCONTINUED] benazepril (LOTENSIN) 20 MG tablet Take 20-40 mg by mouth See admin instructions. Take 40 mg by mouth  in the morning and 20 mg in the evening   [DISCONTINUED] benzonatate (TESSALON) 100 MG capsule Take 1-2 capsules (100-200 mg total) by mouth 2 (two) times daily as needed for cough.   [DISCONTINUED] levofloxacin (LEVAQUIN) 500 MG tablet Take 1 tablet (500 mg total) by mouth daily.   [DISCONTINUED] Multiple Vitamin (MULTIVITAMIN WITH MINERALS) TABS tablet Take 1 tablet by mouth daily.   [DISCONTINUED] torsemide (DEMADEX) 20 MG tablet Take 20 mg by mouth daily.    No facility-administered medications prior to visit.    Review of Systems  Constitutional:  Negative for fatigue and fever.  Respiratory:  Negative for cough and shortness of breath.   Cardiovascular:  Negative for chest pain, palpitations and leg swelling.  Neurological:  Negative for dizziness and headaches.      Objective    BP 115/84 (BP Location: Left Arm, Patient Position: Sitting, Cuff Size: Normal)   Pulse 63   Temp 98.4 F (36.9 C) (Oral)   Resp 17   Ht '5\' 10"'$  (1.778 m)   Wt 194 lb 6.4 oz (88.2 kg)   SpO2 100%   BMI 27.89 kg/m    Physical Exam Constitutional:      General: He is awake.     Appearance: He is well-developed.  HENT:     Head: Normocephalic.  Eyes:     Conjunctiva/sclera: Conjunctivae normal.  Cardiovascular:     Rate and Rhythm: Normal rate and regular rhythm.  Heart sounds: Normal heart sounds.  Pulmonary:     Effort: Pulmonary effort is normal.     Breath sounds: Normal breath sounds.  Skin:    General: Skin is warm.  Neurological:     Mental Status: He is alert and oriented to person, place, and time.  Psychiatric:        Attention and Perception: Attention normal.        Mood and Affect: Mood normal.        Speech: Speech normal.        Behavior: Behavior is cooperative.     No results found for any visits on 06/29/21.  Assessment & Plan     Problem List Items Addressed This Visit       Other   Nonspecific abnormal electrocardiogram (ECG) (EKG) - Primary    Per pt  historically. EKG today Brady sinus with no ST elevation, T wave inversion, compared to 12/2018 with no changes. Will fax to psychiatry.        Relevant Orders   EKG 12-Lead (Completed)    Will try to update pt's records, history of negative cologuard 2023.   Return in about 6 months (around 12/30/2021) for CPE.      I, Mikey Kirschner, PA-C have reviewed all documentation for this visit. The documentation on  06/29/2021  for the exam, diagnosis, procedures, and orders are all accurate and complete.  Mikey Kirschner, PA-C [facility] [address] [place], [place], [address] Office: [phone] Fax: [place]

## 2021-06-29 ENCOUNTER — Encounter: Payer: Self-pay | Admitting: Physician Assistant

## 2021-06-29 ENCOUNTER — Ambulatory Visit: Payer: 59 | Admitting: Physician Assistant

## 2021-06-29 VITALS — BP 115/84 | HR 63 | Temp 98.4°F | Resp 17 | Ht 70.0 in | Wt 194.4 lb

## 2021-06-29 DIAGNOSIS — R9431 Abnormal electrocardiogram [ECG] [EKG]: Secondary | ICD-10-CM | POA: Insufficient documentation

## 2021-06-29 NOTE — Assessment & Plan Note (Addendum)
Per pt historically. EKG today sinus brady with no ST elevation, T wave inversion, compared to 12/2018 with no changes. Will fax to psychiatry.

## 2021-08-14 ENCOUNTER — Encounter: Payer: Self-pay | Admitting: Physician Assistant

## 2021-08-24 ENCOUNTER — Encounter: Payer: Self-pay | Admitting: Physician Assistant

## 2021-08-24 ENCOUNTER — Ambulatory Visit: Payer: 59 | Admitting: Physician Assistant

## 2021-08-24 VITALS — BP 134/100 | HR 104 | Ht 72.0 in | Wt 195.6 lb

## 2021-08-24 DIAGNOSIS — I10 Essential (primary) hypertension: Secondary | ICD-10-CM | POA: Diagnosis not present

## 2021-08-24 DIAGNOSIS — G43109 Migraine with aura, not intractable, without status migrainosus: Secondary | ICD-10-CM | POA: Diagnosis not present

## 2021-08-24 MED ORDER — CYCLOBENZAPRINE HCL 5 MG PO TABS: 5.0000 mg | ORAL_TABLET | Freq: Every evening | ORAL | 1 refills | Status: DC | PRN

## 2021-08-24 MED ORDER — BACLOFEN 10 MG PO TABS: 10.0000 mg | ORAL_TABLET | Freq: Every evening | ORAL | 1 refills | Status: DC | PRN

## 2021-08-24 MED ORDER — ELETRIPTAN HYDROBROMIDE 20 MG PO TABS: 20.0000 mg | ORAL_TABLET | ORAL | 2 refills | Status: DC | PRN
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
## 2021-08-24 NOTE — Patient Instructions (Signed)
[email or web address]

## 2021-08-27 ENCOUNTER — Encounter: Payer: Self-pay | Admitting: Physician Assistant

## 2021-08-27 DIAGNOSIS — G43109 Migraine with aura, not intractable, without status migrainosus: Secondary | ICD-10-CM | POA: Insufficient documentation

## 2021-08-27 NOTE — Assessment & Plan Note (Addendum)
Continue eletriptan PRN. Can take baclofen prn bedtime for neck tension. Ref to neurology, concern w/ tinnitus/hearing loss.

## 2021-08-27 NOTE — Assessment & Plan Note (Signed)
Elevated today which is not usual for him. Likely 2/2 pain. Advised pt to monitor at home.

## 2021-09-05 ENCOUNTER — Other Ambulatory Visit: Payer: Self-pay | Admitting: Nephrology

## 2021-09-05 DIAGNOSIS — I129 Hypertensive chronic kidney disease with stage 1 through stage 4 chronic kidney disease, or unspecified chronic kidney disease: Secondary | ICD-10-CM

## 2021-09-05 DIAGNOSIS — D631 Anemia in chronic kidney disease: Secondary | ICD-10-CM

## 2021-09-11 ENCOUNTER — Ambulatory Visit: Payer: 59

## 2021-09-15 ENCOUNTER — Other Ambulatory Visit: Payer: Self-pay | Admitting: Physician Assistant

## 2021-09-15 DIAGNOSIS — G43109 Migraine with aura, not intractable, without status migrainosus: Secondary | ICD-10-CM

## 2021-11-01 ENCOUNTER — Encounter: Payer: Self-pay | Admitting: Physician Assistant

## 2021-11-11 ENCOUNTER — Other Ambulatory Visit: Payer: Self-pay | Admitting: Physician Assistant

## 2021-11-11 DIAGNOSIS — G43109 Migraine with aura, not intractable, without status migrainosus: Secondary | ICD-10-CM

## 2021-12-05 ENCOUNTER — Other Ambulatory Visit: Payer: Self-pay | Admitting: Physician Assistant

## 2021-12-05 ENCOUNTER — Encounter (INDEPENDENT_AMBULATORY_CARE_PROVIDER_SITE_OTHER): Payer: 59 | Admitting: Physician Assistant

## 2021-12-05 DIAGNOSIS — N529 Male erectile dysfunction, unspecified: Secondary | ICD-10-CM | POA: Diagnosis not present

## 2021-12-05 MED ORDER — TADALAFIL 5 MG PO TABS: 2.5000 mg | ORAL_TABLET | Freq: Every day | ORAL | 1 refills | Status: DC

## 2021-12-05 NOTE — Telephone Encounter (Signed)
Please see the MyChart message reply(ies) for my assessment and plan.    This patient gave consent for this Medical Advice Message and is aware that it may result in a bill to [employer], as well as the possibility of receiving a bill for a co-payment or deductible. They are an established patient, but are not seeking medical advice exclusively about a problem treated during an in person or video visit in the last seven days. I did not recommend an in person or video visit within seven days of my reply.    I spent a total of 8 minutes cumulative time within 7 days through [employer].  Mikey Kirschner, PA-C

## 2021-12-15 ENCOUNTER — Other Ambulatory Visit: Payer: Self-pay | Admitting: Physician Assistant

## 2021-12-15 DIAGNOSIS — G43109 Migraine with aura, not intractable, without status migrainosus: Secondary | ICD-10-CM

## 2022-01-22 ENCOUNTER — Telehealth: Payer: 59 | Admitting: Physician Assistant

## 2022-01-22 DIAGNOSIS — R6889 Other general symptoms and signs: Secondary | ICD-10-CM | POA: Diagnosis not present

## 2022-01-22 MED ORDER — PROMETHAZINE-DM 6.25-15 MG/5ML PO SYRP: 5.0000 mL | ORAL_SOLUTION | Freq: Four times a day (QID) | ORAL | 0 refills | Status: DC | PRN

## 2022-01-22 MED ORDER — OSELTAMIVIR PHOSPHATE 75 MG PO CAPS: 75.0000 mg | ORAL_CAPSULE | Freq: Two times a day (BID) | ORAL | 0 refills | Status: DC

## 2022-01-22 NOTE — Progress Notes (Signed)
Virtual Visit Consent   Timothy Best, you are scheduled for a virtual visit with a [facility] provider today. Just as with appointments in the office, your consent must be obtained to participate. Your consent will be active for this visit and any virtual visit you may have with one of our providers in the next 365 days. If you have a MyChart account, a copy of this consent can be sent to you electronically.  As this is a virtual visit, video technology does not allow for your provider to perform a traditional examination. This may limit your provider's ability to fully assess your condition. If your provider identifies any concerns that need to be evaluated in person or the need to arrange testing (such as labs, EKG, etc.), we will make arrangements to do so. Although advances in technology are sophisticated, we cannot ensure that it will always work on either your end or our end. If the connection with a video visit is poor, the visit may have to be switched to a telephone visit. With either a video or telephone visit, we are not always able to ensure that we have a secure connection.  By engaging in this virtual visit, you consent to the provision of healthcare and authorize for your insurance to be billed (if applicable) for the services provided during this visit. Depending on your insurance coverage, you may receive a charge related to this service.  I need to obtain your verbal consent now. Are you willing to proceed with your visit today? Timothy Best has provided verbal consent on 01/22/2022 for a virtual visit (video or telephone). Mar Daring, PA-C  Date: 01/22/2022 10:41 AM  Virtual Visit via Video Note   I, Mar Daring, connected with  Timothy Best  ([id number], Jun 11, 1975) on 01/22/22 at 10:30 AM EST by a video-enabled telemedicine application and verified that I am speaking with the correct person using two identifiers.  Location: Patient: Virtual Visit  Location Patient: Home Provider: Virtual Visit Location Provider: Home Office   I discussed the limitations of evaluation and management by telemedicine and the availability of in person appointments. The patient expressed understanding and agreed to proceed.    History of Present Illness: Timothy Best is a 46 y.o. who identifies as a male who was assigned male at birth, and is being seen today for possible flu.  HPI: URI  This is a new problem. The current episode started yesterday. The problem has been gradually worsening. Maximum temperature: subjective fevers. The fever has been present for Less than 1 day. Associated symptoms include congestion, coughing, ear pain (last night), headaches, nausea, a plugged ear sensation, rhinorrhea, sinus pain, a sore throat (scratchy) and swollen glands. Pertinent negatives include no diarrhea or vomiting. Associated symptoms comments: Chills, body aches. He has tried increased fluids and acetaminophen for the symptoms. The treatment provided no relief.    At home covid testing is negative.  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   H/O long-term treatment with high-risk medication 04/03/2012   Membranous  glomerulonephritis 03/29/2011    Allergies: No Known Allergies Medications:  Current Outpatient Medications:    oseltamivir (TAMIFLU) 75 MG capsule, Take 1 capsule (75 mg total) by mouth 2 (two) times daily., Disp: 10 capsule, Rfl: 0    promethazine-dextromethorphan (PROMETHAZINE-DM) 6.25-15 MG/5ML syrup, Take 5 mLs by mouth 4 (four) times daily as needed., Disp: 118 mL, Rfl: 0   atorvastatin (LIPITOR) 20 MG tablet, Take 20 mg by mouth daily., Disp: , Rfl:    baclofen (LIORESAL) 10 MG tablet, TAKE 1 TABLET BY MOUTH AT BEDTIME AS NEEDED FOR MUSCLE SPASMS, Disp: 90 tablet, Rfl: 1   eletriptan (RELPAX) 20 MG tablet, TAKE 1 TABLET BY MOUTH AS NEEDED FOR MIGRAINE OR HEADACHE. MAY REPEAT IN 2 HRS IF PERSISTS OR RECURS, Disp: 12 tablet, Rfl: 2   escitalopram (LEXAPRO) 10 MG tablet, Take 10 mg by mouth daily., Disp: , Rfl:    hydroxychloroquine (PLAQUENIL) 200 MG tablet, Take 400 mg by mouth daily., Disp: , Rfl:    levothyroxine (SYNTHROID) 50 MCG tablet, Take 50 mcg by mouth daily., Disp: , Rfl:    mycophenolate (CELLCEPT) 500 MG tablet, Take 1,000 mg by mouth 2 (two) times daily., Disp: , Rfl:    Nutritional Supplements (DHEA PO), Take 1 capsule by mouth daily., Disp: , Rfl:    ondansetron (ZOFRAN ODT) 4 MG disintegrating tablet, Take 1 tablet (4 mg total) by mouth every 8 (eight) hours as needed for nausea or vomiting (migraine)., Disp: 20 tablet, Rfl: 0   tadalafil (CIALIS) 5 MG tablet, Take 0.5-1 tablets (2.5-5 mg total) by mouth daily., Disp: 90 tablet, Rfl: 1   telmisartan (MICARDIS) 40 MG tablet, Take 40 mg by mouth daily., Disp: , Rfl:   Observations/Objective: Patient is well-developed, well-nourished in no acute distress.  Resting comfortably at home.  Head is normocephalic, atraumatic.  No labored breathing.  Speech is clear and coherent with logical content.  Patient is alert and oriented at baseline.    Assessment and Plan: 1. Flu-like symptoms - oseltamivir (TAMIFLU) 75 MG capsule; Take 1 capsule (75 mg total) by mouth 2 (two) times daily.  Dispense: 10 capsule; Refill: 0 - promethazine-dextromethorphan (PROMETHAZINE-DM) 6.25-15 MG/5ML syrup; Take 5 mLs by mouth 4 (four) times daily as needed.  Dispense: 118 mL;  Refill: 0  - Suspected viral URI with negative covid testing - Possible flu as flu A is prevalent in the community at this time - Limited testing availability that would delay appropriate treatment waiting on results - Tamiflu prescribed for possible flu - Promethazine DM for cough - Push fluids - Symptomatic management OTC of choice as needed - Seek in person evaluation if symptoms worsen or fail to improve   Follow Up Instructions: I discussed the assessment and treatment plan with the patient. The patient was provided an opportunity to ask questions and all were answered. The patient agreed with the plan and demonstrated an understanding of the instructions.  A copy of instructions were sent to the patient via MyChart unless otherwise noted below.    The patient was advised to call back or seek an in-person evaluation if the symptoms worsen or if the condition fails to improve as anticipated.  Time:  I spent 9 minutes with the patient via telehealth technology discussing the above problems/concerns.    Mar Daring, PA-C

## 2022-01-22 NOTE — Patient Instructions (Signed)
Timothy Best, thank you for joining Mar Daring, PA-C for today's virtual visit.  While this provider is not your primary care provider (PCP), if your PCP is located in our provider database this encounter information will be shared with them immediately following your visit.   [facility] account gives you access to today's visit and all your visits, tests, and labs performed at [facility] " click here if you don't have a [facility] account or go to [email or web address]  Consent: (Patient) Timothy Best provided verbal consent for this virtual visit at the beginning of the encounter.  Current Medications:  Current Outpatient Medications:    oseltamivir (TAMIFLU) 75 MG capsule, Take 1 capsule (75 mg total) by mouth 2 (two) times daily., Disp: 10 capsule, Rfl: 0   promethazine-dextromethorphan (PROMETHAZINE-DM) 6.25-15 MG/5ML syrup, Take 5 mLs by mouth 4 (four) times daily as needed., Disp: 118 mL, Rfl: 0   atorvastatin (LIPITOR) 20 MG tablet, Take 20 mg by mouth daily., Disp: , Rfl:    baclofen (LIORESAL) 10 MG tablet, TAKE 1 TABLET BY MOUTH AT BEDTIME AS NEEDED FOR MUSCLE SPASMS, Disp: 90 tablet, Rfl: 1   eletriptan (RELPAX) 20 MG tablet, TAKE 1 TABLET BY MOUTH AS NEEDED FOR MIGRAINE OR HEADACHE. MAY REPEAT IN 2 HRS IF PERSISTS OR RECURS, Disp: 12 tablet, Rfl: 2   escitalopram (LEXAPRO) 10 MG tablet, Take 10 mg by mouth daily., Disp: , Rfl:    hydroxychloroquine (PLAQUENIL) 200 MG tablet, Take 400 mg by mouth daily., Disp: , Rfl:    levothyroxine (SYNTHROID) 50 MCG tablet, Take 50 mcg by mouth daily., Disp: , Rfl:    mycophenolate (CELLCEPT) 500 MG tablet, Take 1,000 mg by mouth 2 (two) times daily., Disp: , Rfl:    Nutritional Supplements (DHEA PO), Take 1 capsule by mouth daily., Disp: , Rfl:    ondansetron (ZOFRAN ODT) 4 MG disintegrating tablet, Take 1 tablet (4 mg total) by mouth every 8 (eight) hours as needed for nausea or vomiting  (migraine)., Disp: 20 tablet, Rfl: 0   tadalafil (CIALIS) 5 MG tablet, Take 0.5-1 tablets (2.5-5 mg total) by mouth daily., Disp: 90 tablet, Rfl: 1   telmisartan (MICARDIS) 40 MG tablet, Take 40 mg by mouth daily., Disp: , Rfl:    Medications ordered in this encounter:  Meds ordered this encounter  Medications   oseltamivir (TAMIFLU) 75 MG capsule    Sig: Take 1 capsule (75 mg total) by mouth 2 (two) times daily.    Dispense:  10 capsule    Refill:  0    Order Specific Question:   Supervising Provider    Answer:   Chase Picket [id number]   promethazine-dextromethorphan (PROMETHAZINE-DM) 6.25-15 MG/5ML syrup    Sig: Take 5 mLs by mouth 4 (four) times daily as needed.    Dispense:  118 mL    Refill:  0    Order Specific Question:   Supervising Provider    Answer:   Chase Picket [[id number]]     *If you need refills on other medications prior to your next appointment, please contact your pharmacy*  Follow-Up: Call back or seek an in-person evaluation if the symptoms worsen or if the condition fails to improve as anticipated.  [place] [phone]  Other Instructions Influenza, Adult Influenza, also called "the flu," is a viral infection that mainly affects the respiratory tract. This includes the lungs, nose, and throat. The flu spreads easily from person  to person (is contagious). It causes common cold symptoms, along with high fever and body aches. What are the causes? This condition is caused by the influenza virus. You can get the virus by: Breathing in droplets that are in the air from an infected person's cough or sneeze. Touching something that has the virus on it (has been contaminated) and then touching your mouth, nose, or eyes. What increases the risk? The following factors may make you more likely to get the flu: Not washing or sanitizing your hands often. Having close contact with many people during cold and flu season. Touching your mouth,  eyes, or nose without first washing or sanitizing your hands. Not getting an annual flu shot. You may have a higher risk for the flu, including serious problems, such as a lung infection (pneumonia), if you: Are older than 65. Are pregnant. Have a weakened disease-fighting system (immune system). This includes people who have HIV or AIDS, are on chemotherapy, or are taking medicines that reduce (suppress) the immune system. Have a long-term (chronic) illness, such as heart disease, kidney disease, diabetes, or lung disease. Have a liver disorder. Are severely overweight (morbidly obese). Have anemia. Have asthma. What are the signs or symptoms? Symptoms of this condition usually begin suddenly and last 4-14 days. These may include: Fever and chills. Headaches, body aches, or muscle aches. Sore throat. Cough. Runny or stuffy (congested) nose. Chest discomfort. Poor appetite. Weakness or fatigue. Dizziness. Nausea or vomiting. How is this diagnosed? This condition may be diagnosed based on: Your symptoms and medical history. A physical exam. Swabbing your nose or throat and testing the fluid for the influenza virus. How is this treated? If the flu is diagnosed early, you can be treated with antiviral medicine that is given by mouth (orally) or through an IV. This can help reduce how severe the illness is and how long it lasts. Taking care of yourself at home can help relieve symptoms. Your health care provider may recommend: Taking over-the-counter medicines. Drinking plenty of fluids. In many cases, the flu goes away on its own. If you have severe symptoms or complications, you may be treated in a hospital. Follow these instructions at home: Activity Rest as needed and get plenty of sleep. Stay home from work or school as told by your health care provider. Unless you are visiting your health care provider, avoid leaving home until your fever has been gone for 24 hours without  taking medicine. Eating and drinking Take an oral rehydration solution (ORS). This is a drink that is sold at pharmacies and retail stores. Drink enough fluid to keep your urine pale yellow. Drink clear fluids in small amounts as you are able. Clear fluids include water, ice chips, fruit juice mixed with water, and low-calorie sports drinks. Eat bland, easy-to-digest foods in small amounts as you are able. These foods include bananas, applesauce, rice, lean meats, toast, and crackers. Avoid drinking fluids that contain a lot of sugar or caffeine, such as energy drinks, regular sports drinks, and soda. Avoid alcohol. Avoid spicy or fatty foods. General instructions     Take over-the-counter and prescription medicines only as told by your health care provider. Use a cool mist humidifier to add humidity to the air in your home. This can make it easier to breathe. When using a cool mist humidifier, clean it daily. Empty the water and replace it with clean water. Cover your mouth and nose when you cough or sneeze. Wash your hands with soap  and water often and for at least 20 seconds, especially after you cough or sneeze. If soap and water are not available, use alcohol-based hand sanitizer. Keep all follow-up visits. This is important. How is this prevented?  Get an annual flu shot. This is usually available in late summer, fall, or winter. Ask your health care provider when you should get your flu shot. Avoid contact with people who are sick during cold and flu season. This is generally fall and winter. Contact a health care provider if: You develop new symptoms. You have: Chest pain. Diarrhea. A fever. Your cough gets worse. You produce more mucus. You feel nauseous or you vomit. Get help right away if you: Develop shortness of breath or have difficulty breathing. Have skin or nails that turn a bluish color. Have severe pain or stiffness in your neck. Develop a sudden headache or  sudden pain in your face or ear. Cannot eat or drink without vomiting. These symptoms may represent a serious problem that is an emergency. Do not wait to see if the symptoms will go away. Get medical help right away. Call your local emergency services (911 in the [place] Do not drive yourself to the hospital. Summary Influenza, also called "the flu," is a viral infection that primarily affects your respiratory tract. Symptoms of the flu usually begin suddenly and last 4-14 days. Getting an annual flu shot is the best way to prevent getting the flu. Stay home from work or school as told by your health care provider. Unless you are visiting your health care provider, avoid leaving home until your fever has been gone for 24 hours without taking medicine. Keep all follow-up visits. This is important. This information is not intended to replace advice given to you by your health care provider. Make sure you discuss any questions you have with your health care provider. Document Revised: 09/10/2019 Document Reviewed: 09/10/2019 Elsevier Patient Education  [place].    If you have been instructed to have an in-person evaluation today at a local Urgent Care facility, please use the link below. It will take you to a list of all of our available [facility], including address, phone number and hours of operation. Please do not delay care.  [facility]  If you or a family member do not have a primary care provider, use the link below to schedule a visit and establish care. When you choose a [facility] primary care physician or advanced practice provider, you gain a long-term partner in health. Find a Primary Care Provider  Learn more about [facility] in-office and virtual care options: [facility]

## 2022-02-27 ENCOUNTER — Encounter: Payer: Self-pay | Admitting: Physician Assistant

## 2022-03-07 NOTE — Progress Notes (Signed)
I,Sha'taria Tyson,acting as a Education administrator for [employer], PA-C.,have documented all relevant documentation on the behalf of Mikey Kirschner, PA-C,as directed by  Mikey Kirschner, PA-C while in the presence of Mikey Kirschner, PA-C.   Established patient visit   Patient: Timothy Best   DOB: 08/13/1975   47 y.o. Male  MRN: [id number] Visit Date: 03/08/2022  Today's healthcare provider: Mikey Kirschner, PA-C   Cc. HTN f/u   Subjective    HPI  Skin lesion -right side of back, itchy. History of ezcema.   Nasal congestion -chronic, seems more prevalent with cpap use. Had old rx of azelastine-flonase that helps, would like a refill.  Hypertension, follow-up  BP Readings from Last 3 Encounters:  03/08/22 132/87  08/24/21 (!) 134/100  06/29/21 115/84   Wt Readings from Last 3 Encounters:  03/08/22 202 lb 9.6 oz (91.9 kg)  08/24/21 195 lb 9.6 oz (88.7 kg)  06/29/21 194 lb 6.4 oz (88.2 kg)     He was last seen for hypertension 6 months ago.  BP at that visit was 134/100. Management since that visit includes continue current treatment and monitor at home.  Outside blood pressures are checked on occasions Symptoms: No chest pain No chest pressure  No palpitations No syncope  No dyspnea No orthopnea  No paroxysmal nocturnal dyspnea No lower extremity edema  ---------------------------------------------------------------------------------------------------  Medications: Outpatient Medications Prior to Visit  Medication Sig   atorvastatin (LIPITOR) 20 MG tablet Take 20 mg by mouth daily.   baclofen (LIORESAL) 10 MG tablet TAKE 1 TABLET BY MOUTH AT BEDTIME AS NEEDED FOR MUSCLE SPASMS   eletriptan (RELPAX) 20 MG tablet TAKE 1 TABLET BY MOUTH AS NEEDED FOR MIGRAINE OR HEADACHE. MAY REPEAT IN 2 HRS IF PERSISTS OR RECURS   hydroxychloroquine (PLAQUENIL) 200 MG tablet Take 400 mg by mouth daily.   levothyroxine (SYNTHROID) 50 MCG tablet Take 50 mcg by mouth daily.   methylphenidate  18 MG PO CR tablet Take 18 mg by mouth every morning.   mycophenolate (CELLCEPT) 500 MG tablet Take 1,000 mg by mouth 2 (two) times daily.   Nutritional Supplements (DHEA PO) Take 1 capsule by mouth daily.   tadalafil (CIALIS) 5 MG tablet Take 0.5-1 tablets (2.5-5 mg total) by mouth daily.   benazepril (LOTENSIN) 40 MG tablet Take by mouth.   [DISCONTINUED] escitalopram (LEXAPRO) 10 MG tablet Take 10 mg by mouth daily.   [DISCONTINUED] ondansetron (ZOFRAN ODT) 4 MG disintegrating tablet Take 1 tablet (4 mg total) by mouth every 8 (eight) hours as needed for nausea or vomiting (migraine).   [DISCONTINUED] oseltamivir (TAMIFLU) 75 MG capsule Take 1 capsule (75 mg total) by mouth 2 (two) times daily.   [DISCONTINUED] promethazine-dextromethorphan (PROMETHAZINE-DM) 6.25-15 MG/5ML syrup Take 5 mLs by mouth 4 (four) times daily as needed.   [DISCONTINUED] telmisartan (MICARDIS) 40 MG tablet Take 40 mg by mouth daily.   No facility-administered medications prior to visit.    Review of Systems  Constitutional:  Negative for fatigue and fever.  Respiratory:  Negative for cough and shortness of breath.   Cardiovascular:  Negative for chest pain, palpitations and leg swelling.  Neurological:  Negative for dizziness and headaches.      Objective    Blood pressure 132/87, pulse 73, height 6' (1.829 m), weight 202 lb 9.6 oz (91.9 kg), SpO2 100 %.   Physical Exam Constitutional:      General: He is awake.     Appearance: He is well-developed.  HENT:  Head: Normocephalic.  Eyes:     Conjunctiva/sclera: Conjunctivae normal.  Cardiovascular:     Rate and Rhythm: Normal rate and regular rhythm.     Heart sounds: Normal heart sounds.  Pulmonary:     Effort: Pulmonary effort is normal.     Breath sounds: Normal breath sounds.  Musculoskeletal:     Right lower leg: No edema.     Left lower leg: No edema.  Skin:    General: Skin is warm.     Comments: Right mid-low back there is a 2-3 mm  circular patch of dry, flaking skin. No color change or bleeding  Neurological:     Mental Status: He is alert and oriented to person, place, and time.  Psychiatric:        Attention and Perception: Attention normal.        Mood and Affect: Mood normal.        Speech: Speech normal.        Behavior: Behavior is cooperative.      No results found for any visits on 03/08/22.  Assessment & Plan     Problem List Items Addressed This Visit       Cardiovascular and Mediastinum   Essential hypertension - Primary    Chronic, well controlled, f/b nephrology Continue current medications Reviewed last cmp F/u 6 mo       Relevant Medications   benazepril (LOTENSIN) 40 MG tablet     Musculoskeletal and Integument   Dermatitis, eczematoid    Rx triamcinolone to use 1-2 times a day on area of concern      Relevant Medications   triamcinolone cream (KENALOG) 0.1 %     Other   Hyperlipidemia    Managed with lipitor 20  Repeat fasting lipids      Relevant Medications   benazepril (LOTENSIN) 40 MG tablet   Other Relevant Orders   Lipid Profile   Other Visit Diagnoses     Nasal congestion       Relevant Medications   Azelastine-Fluticasone 137-50 MCG/ACT SUSP   Need for Td vaccine       Relevant Orders   Td : Tetanus/diphtheria >7yo Preservative  free (Completed)   Need for immunization against influenza       Relevant Orders   Flu Vaccine QUAD 26moIM (Fluarix, Fluzone & Alfiuria Quad PF) (Completed)      Nasal congestion -refilled nasal spray  Ezcema  -rx triamcinalone to use topically  Return in about 6 months (around 09/06/2022) for CPE.     I, LMikey Kirschner PA-C have reviewed all documentation for this visit. The documentation on  03/08/22 for the exam, diagnosis, procedures, and orders are all accurate and complete.  LMikey Kirschner PA-C [facility] [place] [address]Office: [phone]: [phone]

## 2022-03-08 ENCOUNTER — Encounter: Payer: Self-pay | Admitting: Physician Assistant

## 2022-03-08 ENCOUNTER — Ambulatory Visit: Payer: 59 | Admitting: Physician Assistant

## 2022-03-08 VITALS — BP 132/87 | HR 73 | Ht 72.0 in | Wt 202.6 lb

## 2022-03-08 DIAGNOSIS — Z23 Encounter for immunization: Secondary | ICD-10-CM | POA: Diagnosis not present

## 2022-03-08 DIAGNOSIS — R0981 Nasal congestion: Secondary | ICD-10-CM | POA: Diagnosis not present

## 2022-03-08 DIAGNOSIS — G4733 Obstructive sleep apnea (adult) (pediatric): Secondary | ICD-10-CM | POA: Insufficient documentation

## 2022-03-08 DIAGNOSIS — L308 Other specified dermatitis: Secondary | ICD-10-CM | POA: Diagnosis not present

## 2022-03-08 DIAGNOSIS — I10 Essential (primary) hypertension: Secondary | ICD-10-CM | POA: Diagnosis not present

## 2022-03-08 DIAGNOSIS — E782 Mixed hyperlipidemia: Secondary | ICD-10-CM | POA: Diagnosis not present

## 2022-03-08 MED ORDER — TRIAMCINOLONE ACETONIDE 0.1 % EX CREA: 1.0000 | TOPICAL_CREAM | Freq: Two times a day (BID) | CUTANEOUS | 1 refills | Status: AC | PRN

## 2022-03-08 MED ORDER — AZELASTINE-FLUTICASONE 137-50 MCG/ACT NA SUSP: 1.0000 | Freq: Two times a day (BID) | NASAL | 3 refills | Status: AC

## 2022-03-08 NOTE — Assessment & Plan Note (Signed)
Chronic, well controlled, f/b nephrology Continue current medications Reviewed last cmp F/u 6 mo

## 2022-03-08 NOTE — Assessment & Plan Note (Signed)
Managed with lipitor 20  Repeat fasting lipids

## 2022-03-08 NOTE — Assessment & Plan Note (Signed)
Rx triamcinolone to use 1-2 times a day on area of concern

## 2022-03-22 ENCOUNTER — Encounter (INDEPENDENT_AMBULATORY_CARE_PROVIDER_SITE_OTHER): Payer: 59 | Admitting: Physician Assistant

## 2022-03-22 DIAGNOSIS — R7982 Elevated C-reactive protein (CRP): Secondary | ICD-10-CM | POA: Diagnosis not present

## 2022-03-25 NOTE — Telephone Encounter (Signed)
Please see the MyChart message reply(ies) for my assessment and plan.    This patient gave consent for this Medical Advice Message and is aware that it may result in a bill to [employer], as well as the possibility of receiving a bill for a co-payment or deductible. They are an established patient, but are not seeking medical advice exclusively about a problem treated during an in person or video visit in the last seven days. I did not recommend an in person or video visit within seven days of my reply.    I spent a total of 10 minutes cumulative time within 7 days through [employer].  Mikey Kirschner, PA-C

## 2022-03-28 ENCOUNTER — Other Ambulatory Visit: Payer: Self-pay | Admitting: Physician Assistant

## 2022-03-28 DIAGNOSIS — R7982 Elevated C-reactive protein (CRP): Secondary | ICD-10-CM

## 2022-03-29 ENCOUNTER — Ambulatory Visit: Payer: Self-pay

## 2022-03-29 ENCOUNTER — Ambulatory Visit (INDEPENDENT_AMBULATORY_CARE_PROVIDER_SITE_OTHER): Payer: 59 | Admitting: Physician Assistant

## 2022-03-29 ENCOUNTER — Encounter: Payer: Self-pay | Admitting: Physician Assistant

## 2022-03-29 VITALS — BP 126/85 | HR 70 | Ht 72.0 in | Wt 199.0 lb

## 2022-03-29 DIAGNOSIS — T1591XA Foreign body on external eye, part unspecified, right eye, initial encounter: Secondary | ICD-10-CM

## 2022-03-29 MED ORDER — ERYTHROMYCIN 5 MG/GM OP OINT: 1.0000 | TOPICAL_OINTMENT | Freq: Every day | OPHTHALMIC | 0 refills | Status: AC

## 2022-03-29 NOTE — Progress Notes (Signed)
Established patient visit   Patient: Timothy Best   DOB: 09-07-1975   47 y.o. Male  MRN: [id number] Visit Date: 03/29/2022  Today's healthcare provider: Mikey Kirschner, PA-C   Cc. Eye pain  Subjective    HPI  Pt  reports he injured his eye on Tuesday while working on his car, unsure if scratched it or if there is something in it. His eye has been tearing, burning and is red. He tried flushing his eye without relief. Denies vision changes.  Medications: Outpatient Medications Prior to Visit  Medication Sig   atorvastatin (LIPITOR) 20 MG tablet Take 20 mg by mouth daily.   Azelastine-Fluticasone 137-50 MCG/ACT SUSP Place 1 spray into the nose every 12 (twelve) hours.   baclofen (LIORESAL) 10 MG tablet TAKE 1 TABLET BY MOUTH AT BEDTIME AS NEEDED FOR MUSCLE SPASMS   benazepril (LOTENSIN) 40 MG tablet Take by mouth.   eletriptan (RELPAX) 20 MG tablet TAKE 1 TABLET BY MOUTH AS NEEDED FOR MIGRAINE OR HEADACHE. MAY REPEAT IN 2 HRS IF PERSISTS OR RECURS   hydroxychloroquine (PLAQUENIL) 200 MG tablet Take 400 mg by mouth daily.   levothyroxine (SYNTHROID) 50 MCG tablet Take 50 mcg by mouth daily.   methylphenidate 18 MG PO CR tablet Take 18 mg by mouth every morning.   mycophenolate (CELLCEPT) 500 MG tablet Take 1,000 mg by mouth 2 (two) times daily.   Nutritional Supplements (DHEA PO) Take 1 capsule by mouth daily.   tadalafil (CIALIS) 5 MG tablet Take 0.5-1 tablets (2.5-5 mg total) by mouth daily.   triamcinolone cream (KENALOG) 0.1 % Apply 1 Application topically 2 (two) times daily as needed.   No facility-administered medications prior to visit.    Review of Systems  Constitutional:  Negative for fatigue and fever.  Eyes:  Positive for pain and redness.  Respiratory:  Negative for cough and shortness of breath.   Cardiovascular:  Negative for chest pain, palpitations and leg swelling.  Neurological:  Negative for dizziness and headaches.      Objective    BP 126/85  (BP Location: Left Arm, Patient Position: Sitting, Cuff Size: Large)   Pulse 70   Ht 6' (1.829 m)   Wt 199 lb (90.3 kg)   SpO2 98%   BMI 26.99 kg/m    Physical Exam Vitals reviewed.  Constitutional:      Appearance: He is not ill-appearing.  HENT:     Head: Normocephalic.  Eyes:     Conjunctiva/sclera: Conjunctivae normal.      Comments: Injection to right eye with some swelling of the upper and lower lids.  On exam there appears to be a small, back, circular foreign body inferior to the pupil  Cardiovascular:     Rate and Rhythm: Normal rate.  Pulmonary:     Effort: Pulmonary effort is normal. No respiratory distress.  Neurological:     General: No focal deficit present.     Mental Status: He is alert and oriented to person, place, and time.  Psychiatric:        Mood and Affect: Mood normal.        Behavior: Behavior normal.      No results found for any visits on 03/29/22.  Assessment & Plan     FB right eye We were able to get him an appt with [facility] today.  I had already rx erythromycin ointment in case pt was unable to seek emergent care today.  Return if  symptoms worsen or fail to improve.     I, Mikey Kirschner, PA-C have reviewed all documentation for this visit. The documentation on  03/29/22 for the exam, diagnosis, procedures, and orders are all accurate and complete.  Mikey Kirschner, PA-C [facility] [address] [place], [place], [address] Office: [phone] Fax: [place]

## 2022-03-29 NOTE — Telephone Encounter (Signed)
  Chief Complaint: Painful red swollen right eye, mild HA Symptoms: Eye is constantly watering, mild HA Frequency: Tuesday Pertinent Negatives: Patient denies fever,  Disposition: []$ ED /[]$ Urgent Care (no appt availability in office) / [x]$ Appointment(In office/virtual)/ []$  [facility]/ []$ Home Care/ []$ Refused Recommended Disposition /[]$ [facility]/ []$  Follow-up with PCP Additional Notes: Pt injured his eye on Tuesday while working on his car. Pt is unsure if he scratched his cornea or if there is a foreign object in eye. Pt has tried OTC eye lubricant, and drops without relief.    Reason for Disposition  Constant tearing or blinking  Answer Assessment - Initial Assessment Questions 1. MECHANISM: "How did the injury happen?"      Working on vehicle 2. ONSET: "When did the injury happen?" (Minutes or hours ago)      Tuesday 3. LOCATION: "What part of the eye is injured?" (cornea, sclera, eyelid, or periorbital tissue)     Right eye 4. APPEARANCE: "What does the eye look like?"      Swollen and red 5. VISION: "Is the vision blurred?"      Yes from tearing 6. PAIN: "Is it painful?" If Yes, ask: "How bad is the pain?"   (e.g., Scale 1-10; or mild, moderate, severe)     7/10 7. SIZE: For cuts, bruises, or swelling, ask: "How large is it?" (e.g., inches or centimeters)      no 9. CONTACTS: "Do you wear contacts?"     no 10. OTHER SYMPTOMS: "Do you have any other symptoms?" (e.g., headache, neck pain, vomiting)       HA  Protocols used: Eye Injury-A-AH

## 2022-03-31 LAB — ANTIPHOSPHOLOPID AB PANEL
Anticardiolipin IgA: <2 APL-U/mL (ref <20.0)
Anticardiolipin IgG: <2 GPL-U/mL (ref <20.0)
Anticardiolipin IgM: <2 MPL-U/mL (ref <20.0)
Beta-2 Glyco 1 IgA: <2 U/mL (ref <20.0)
Beta-2 Glyco 1 IgM: <2 U/mL (ref <20.0)
Beta-2 Glyco I IgG: <2 U/mL (ref <20.0)
Phosphatidylserine/Prothrombin Ab (IgG): <9 U (ref <=30)
Phosphatidylserine/Prothrombin Ab (IgM): <9 U (ref <=30)

## 2022-04-01 ENCOUNTER — Encounter: Payer: Self-pay | Admitting: Physician Assistant

## 2022-04-02 LAB — ANTI-NUCLEAR AB-TITER (ANA TITER): ANA Titer 1: 1:640 {titer} — ABNORMAL HIGH

## 2022-04-02 LAB — RHEUMATOID FACTOR: Rheumatoid fact SerPl-aCnc: 20 IU/mL — ABNORMAL HIGH (ref <14)

## 2022-04-02 LAB — SJOGREN'S SYNDROME ANTIBODS(SSA + SSB)
SSA (Ro) (ENA) Antibody, IgG: >8 AI — AB
SSB (La) (ENA) Antibody, IgG: 2.8 AI — AB

## 2022-04-02 LAB — SEDIMENTATION RATE: Sed Rate: 6 mm/h (ref 0–15)

## 2022-04-02 LAB — ANA: Anti Nuclear Antibody (ANA): POSITIVE — AB

## 2022-04-02 LAB — ANTI-DNA ANTIBODY, DOUBLE-STRANDED: ds DNA Ab: <1 IU/mL

## 2022-05-06 ENCOUNTER — Other Ambulatory Visit: Payer: Self-pay | Admitting: Physician Assistant

## 2022-05-06 DIAGNOSIS — G43109 Migraine with aura, not intractable, without status migrainosus: Secondary | ICD-10-CM

## 2022-06-26 ENCOUNTER — Other Ambulatory Visit: Payer: Self-pay | Admitting: Physician Assistant

## 2022-06-26 DIAGNOSIS — G43109 Migraine with aura, not intractable, without status migrainosus: Secondary | ICD-10-CM

## 2022-07-31 ENCOUNTER — Encounter: Payer: Self-pay | Admitting: Physician Assistant

## 2022-08-01 ENCOUNTER — Other Ambulatory Visit: Payer: Self-pay

## 2022-08-01 ENCOUNTER — Other Ambulatory Visit: Payer: Self-pay | Admitting: Physician Assistant

## 2022-08-01 DIAGNOSIS — N529 Male erectile dysfunction, unspecified: Secondary | ICD-10-CM

## 2022-08-26 ENCOUNTER — Telehealth: Payer: Self-pay | Admitting: Physician Assistant

## 2022-09-04 ENCOUNTER — Encounter: Payer: Self-pay | Admitting: Physician Assistant

## 2022-09-04 ENCOUNTER — Telehealth: Payer: Self-pay | Admitting: Physician Assistant

## 2022-09-04 NOTE — Telephone Encounter (Signed)
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 He doesn't know if he needs a follow up with her or not so he said to let him know that as well. Please advise

## 2022-09-05 ENCOUNTER — Encounter: Payer: Self-pay | Admitting: Physician Assistant

## 2022-09-05 ENCOUNTER — Ambulatory Visit: Payer: 59 | Admitting: Physician Assistant

## 2022-09-05 VITALS — BP 120/86 | HR 84 | Temp 98.1°F | Resp 20 | Ht 72.0 in | Wt 192.0 lb

## 2022-09-05 DIAGNOSIS — M79675 Pain in left toe(s): Secondary | ICD-10-CM | POA: Diagnosis not present

## 2022-09-05 MED ORDER — PREDNISONE 10 MG PO TABS
ORAL_TABLET | ORAL | 0 refills | Status: AC
Start: 2022-09-05 — End: 2022-09-11

## 2022-09-05 NOTE — Progress Notes (Signed)
Established patient visit   Patient: Timothy Best   DOB: 1975-12-04   47 y.o. Male  MRN: [id number] Visit Date: 09/05/2022  Today's healthcare provider: Alfredia Ferguson, PA-C   Chief Complaint  Patient presents with   Toe Pain    Left great toe    Subjective    HPI  Pt reports L great toe pain, medially for the last few days. Associated with swelling ,redness. Tender to touch. No hx of gout, but pt has chronic issues with the great toe and follows with podiatry. Medications: Outpatient Medications Prior to Visit  Medication Sig   atorvastatin (LIPITOR) 20 MG tablet Take 20 mg by mouth daily.   Azelastine-Fluticasone 137-50 MCG/ACT SUSP Place 1 spray into the nose every 12 (twelve) hours.   baclofen (LIORESAL) 10 MG tablet TAKE 1 TABLET BY MOUTH AT BEDTIME AS NEEDED FOR MUSCLE SPASMS.   benazepril (LOTENSIN) 40 MG tablet Take by mouth.   eletriptan (RELPAX) 20 MG tablet TAKE 1 TABLET BY MOUTH AS NEEDED FOR MIGRAINE OR HEADACHE. MAY REPEAT IN 2 HRS IF PERSISTS OR RECURS   hydroxychloroquine (PLAQUENIL) 200 MG tablet Take 400 mg by mouth daily.   levothyroxine (SYNTHROID) 50 MCG tablet Take 50 mcg by mouth daily.   methylphenidate 18 MG PO CR tablet Take 18 mg by mouth every morning.   mycophenolate (CELLCEPT) 500 MG tablet Take 1,000 mg by mouth 2 (two) times daily.   Nutritional Supplements (DHEA PO) Take 1 capsule by mouth daily.   tadalafil (CIALIS) 5 MG tablet TAKE 1/2 TO 1 TABLET ONCE EVERY DAY   triamcinolone cream (KENALOG) 0.1 % Apply 1 Application topically 2 (two) times daily as needed.   No facility-administered medications prior to visit.    Review of Systems  Constitutional:  Negative for fatigue and fever.  Respiratory:  Negative for cough and shortness of breath.   Cardiovascular:  Negative for chest pain, palpitations and leg swelling.  Musculoskeletal:  Positive for arthralgias.  Neurological:  Negative for dizziness and headaches.        Objective    BP 120/86 (BP Location: Left Arm, Patient Position: Sitting, Cuff Size: Normal)   Pulse 84   Temp 98.1 F (36.7 C) (Oral)   Resp 20   Ht 6' (1.829 m)   Wt 192 lb (87.1 kg)   SpO2 99%   BMI 26.04 kg/m   Physical Exam Vitals reviewed.  Constitutional:      Appearance: He is not ill-appearing.  HENT:     Head: Normocephalic.  Eyes:     Conjunctiva/sclera: Conjunctivae normal.  Cardiovascular:     Rate and Rhythm: Normal rate.  Pulmonary:     Effort: Pulmonary effort is normal. No respiratory distress.  Feet:     Comments: L great toe with bunion, erythema and tenderness to light touch of MTP No edema Neurological:     General: No focal deficit present.     Mental Status: He is alert and oriented to person, place, and time.  Psychiatric:        Mood and Affect: Mood normal.        Behavior: Behavior normal.      No results found for any visits on 09/05/22.  Assessment & Plan     1. Great toe pain, left Ordered uric acid r/o gout given h/o kidney issues  Pt unable to take nsaids Rx prednisone course  - predniSONE (DELTASONE) 10 MG tablet; Take 6 tablets (60 mg  total) by mouth daily with breakfast for 1 day, THEN 5 tablets (50 mg total) daily with breakfast for 1 day, THEN 4 tablets (40 mg total) daily with breakfast for 1 day, THEN 3 tablets (30 mg total) daily with breakfast for 1 day, THEN 2 tablets (20 mg total) daily with breakfast for 1 day, THEN 1 tablet (10 mg total) daily with breakfast for 1 day.  Dispense: 21 tablet; Refill: 0 - Uric acid   Return if symptoms worsen or fail to improve.      I, Alfredia Ferguson, PA-C have reviewed all documentation for this visit. The documentation on  09/05/22   for the exam, diagnosis, procedures, and orders are all accurate and complete.    Alfredia Ferguson, PA-C  [facility] at [facility] [phone] (phone) [phone] (fax)  [facility]

## 2022-09-06 ENCOUNTER — Ambulatory Visit: Payer: 59 | Admitting: Physician Assistant

## 2022-12-10 ENCOUNTER — Encounter: Payer: Self-pay | Admitting: Physician Assistant

## 2022-12-11 ENCOUNTER — Encounter: Payer: Self-pay | Admitting: Physician Assistant

## 2022-12-11 ENCOUNTER — Telehealth: Payer: 59 | Admitting: Physician Assistant

## 2022-12-11 DIAGNOSIS — F909 Attention-deficit hyperactivity disorder, unspecified type: Secondary | ICD-10-CM | POA: Diagnosis not present

## 2022-12-11 MED ORDER — BUPROPION HCL ER (XL) 150 MG PO TB24
ORAL_TABLET | ORAL | 0 refills | Status: DC
Start: 2022-12-11 — End: 2022-12-24

## 2022-12-11 NOTE — Assessment & Plan Note (Signed)
Discussed MOA of Wellbutrin, advised no alcohol consumption while taking.  Patient has no history of seizures.  Advised it might help with some of his ADHD symptoms but is typically not a medication used for anxiety.  Patient is still wanting to try as an option to control ADHD symptoms.  Advised we try, start with 150 mg a day for a week and then increase to 300 mg a day.  F/u 4-6 weeks

## 2022-12-11 NOTE — Progress Notes (Signed)
MyChart Video Visit    Virtual Visit via Video Note   This format is felt to be most appropriate for this patient at this time. Physical exam was limited by quality of the video and audio technology used for the visit.   Patient location: at work, alone Provider location: [email or web address]  I discussed the limitations of evaluation and management by telemedicine and the availability of in person appointments. The patient expressed understanding and agreed to proceed.  Patient: Timothy Best   DOB: [birth year]   47 y.o. Male  MRN: [id number] Visit Date: 12/11/2022  Today's healthcare provider: Alfredia Ferguson, PA-C   Cc. Adhd, wellbutrin  Subjective    HPI   Patient reports today with questions regarding ADHD medications.  He was managed on Concerta 18 mg for the last few months through his neurologist, but has been having difficulty filling the prescription leading to inconsistent use.  He reports that Concerta did help with focus and concentration but some other symptoms of ADHD/anxiety were still present.  He is interested in trying Wellbutrin as an alternative.   Medications: Outpatient Medications Prior to Visit  Medication Sig   atorvastatin (LIPITOR) 20 MG tablet Take 20 mg by mouth daily.   Azelastine-Fluticasone 137-50 MCG/ACT SUSP Place 1 spray into the nose every 12 (twelve) hours.   baclofen (LIORESAL) 10 MG tablet TAKE 1 TABLET BY MOUTH AT BEDTIME AS NEEDED FOR MUSCLE SPASMS.   benazepril (LOTENSIN) 40 MG tablet Take by mouth.   eletriptan (RELPAX) 20 MG tablet TAKE 1 TABLET BY MOUTH AS NEEDED FOR MIGRAINE OR HEADACHE. MAY REPEAT IN 2 HRS IF PERSISTS OR RECURS   hydroxychloroquine (PLAQUENIL) 200 MG tablet Take 400 mg by mouth daily.   levothyroxine (SYNTHROID) 50 MCG tablet Take 50 mcg by mouth daily.   methylphenidate 18 MG PO CR tablet Take 18 mg by mouth every morning.   mycophenolate (CELLCEPT) 500 MG tablet Take 1,000 mg by mouth 2 (two) times daily.   Nutritional  Supplements (DHEA PO) Take 1 capsule by mouth daily.   tadalafil (CIALIS) 5 MG tablet TAKE 1/2 TO 1 TABLET ONCE EVERY DAY   triamcinolone cream (KENALOG) 0.1 % Apply 1 Application topically 2 (two) times daily as needed.   No facility-administered medications prior to visit.    Review of Systems  Constitutional:  Negative for fatigue and fever.  Respiratory:  Negative for cough and shortness of breath.   Cardiovascular:  Negative for chest pain, palpitations and leg swelling.  Neurological:  Negative for dizziness and headaches.        Objective    There were no vitals taken for this visit.      Physical Exam Constitutional:      Appearance: Normal appearance. He is not ill-appearing.  Neurological:     Mental Status: He is oriented to person, place, and time.  Psychiatric:        Mood and Affect: Mood normal.        Behavior: Behavior normal.        Assessment & Plan     Problem List Items Addressed This Visit       Other   Attention deficit hyperactivity disorder (ADHD) - Primary    Discussed MOA of Wellbutrin, advised no alcohol consumption while taking.  Patient has no history of seizures.  Advised it might help with some of his ADHD symptoms but is typically not a medication used for anxiety.  Patient is still wanting to try as  an option to control ADHD symptoms.  Advised we try, start with 150 mg a day for a week and then increase to 300 mg a day.  F/u 4-6 weeks      Relevant Medications   buPROPion (WELLBUTRIN XL) 150 MG 24 hr tablet     Return in about 4 weeks (around 01/08/2023) for adhd.     I discussed the assessment and treatment plan with the patient. The patient was provided an opportunity to ask questions and all were answered. The patient agreed with the plan and demonstrated an understanding of the instructions.   The patient was advised to call back or seek an in-person evaluation if the symptoms worsen or if the condition fails to improve  as anticipated.  I provided 10 minutes of non-face-to-face time during this encounter.  Alfredia Ferguson, PA-C [facility] at [facility] [phone] (phone) [phone] (fax)  [facility]

## 2022-12-24 ENCOUNTER — Other Ambulatory Visit: Payer: Self-pay | Admitting: Physician Assistant

## 2022-12-24 DIAGNOSIS — F909 Attention-deficit hyperactivity disorder, unspecified type: Secondary | ICD-10-CM

## 2022-12-24 MED ORDER — BUPROPION HCL ER (XL) 300 MG PO TB24
300.0000 mg | ORAL_TABLET | Freq: Every day | ORAL | 1 refills | Status: DC
Start: 2022-12-24 — End: 2023-06-19

## 2023-01-22 ENCOUNTER — Other Ambulatory Visit: Payer: Self-pay | Admitting: Physician Assistant

## 2023-01-22 DIAGNOSIS — N529 Male erectile dysfunction, unspecified: Secondary | ICD-10-CM

## 2023-04-01 ENCOUNTER — Telehealth: Payer: 59

## 2023-04-01 ENCOUNTER — Telehealth (INDEPENDENT_AMBULATORY_CARE_PROVIDER_SITE_OTHER): Payer: 59 | Admitting: Nurse Practitioner

## 2023-04-01 ENCOUNTER — Encounter: Payer: Self-pay | Admitting: Nurse Practitioner

## 2023-04-01 VITALS — Ht 72.0 in | Wt 200.0 lb

## 2023-04-01 DIAGNOSIS — J0110 Acute frontal sinusitis, unspecified: Secondary | ICD-10-CM

## 2023-04-01 DIAGNOSIS — R6884 Jaw pain: Secondary | ICD-10-CM | POA: Diagnosis not present

## 2023-04-01 MED ORDER — DOXYCYCLINE HYCLATE 100 MG PO TABS
100.0000 mg | ORAL_TABLET | Freq: Two times a day (BID) | ORAL | 0 refills | Status: DC
Start: 2023-04-01 — End: 2023-08-07

## 2023-04-01 NOTE — Patient Instructions (Signed)
 It was great to see you!  Start doxycyline twice a day for 10 days  You can use flonase or saline nasal spray as well   Drink plenty of fluids  You can use warm compresses and baclofen as needed for jaw pain.   Let's follow-up if your symptoms worsen or don't improve.   Take care,  Rodman Pickle, NP

## 2023-04-01 NOTE — Progress Notes (Signed)
 [facility] [address] [place] [address] Dept: [phone] Dept Fax: [phone]  Virtual Video Visit  I connected with Timothy Best on 04/01/23 at 10:20 AM EST by a video enabled telemedicine application and verified that I am speaking with the correct person using two identifiers.  Location patient: Home Location provider: Clinic Persons participating in the virtual visit: Patient; Rodman Pickle, NP; Malena Peer, CMA  I discussed the limitations of evaluation and management by telemedicine and the availability of in person appointments. The patient expressed understanding and agreed to proceed.  Chief Complaint  Patient presents with   Sinusitis    Nasal congestion and facial pain,sore throat for 4-5 days    SUBJECTIVE:  HPI: Timothy Best is a 48 y.o. male who presents with nasal congestion, sinus pain, and sore throat for 5 days.   UPPER RESPIRATORY TRACT INFECTION  Fever: no Cough: no Shortness of breath: no Wheezing: no Chest pain: no Chest tightness: no Chest congestion: no Nasal congestion: yes Runny nose: no Post nasal drip: yes Sneezing: no Sore throat: yes Swollen glands: no Sinus pressure: yes Headache: yes Face pain: yes Toothache: no Ear pain: no bilateral Ear pressure: yes bilateral Eyes red/itching:no Eye drainage/crusting: yes  Vomiting: no Rash: no Fatigue: yes Sick contacts: yes - son Context: worse Recurrent sinusitis: no Relief with OTC cold/cough medications: no  Treatments attempted: sudafed  Patient Active Problem List   Diagnosis Date Noted   Attention deficit hyperactivity disorder (ADHD) 12/11/2022   OSA (obstructive sleep apnea) 03/08/2022   Migraine with aura and without status migrainosus, not intractable 08/27/2021   Nonspecific abnormal electrocardiogram (ECG) (EKG) 06/29/2021   Essential hypertension 08/23/2019   Hypercalcemia 08/23/2019    Hyperlipidemia 08/23/2019   Mass of right lung 04/22/2018   Proteinuria 06/27/2017   Erectile dysfunction 06/24/2017   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   Dermatitis, eczematoid 01/15/2016   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) 06/23/2015   H/O long-term treatment with high-risk medication 04/03/2012   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    Past Surgical History:  Procedure Laterality Date   NO PAST SURGERIES     RENAL BIOPSY     THYROIDECTOMY Left 12/22/2018   Procedure: HEMI THYROIDECTOMY;  Surgeon: Bud Face, MD;  Location: [facility] ORS;  Service: ENT;  Laterality: Left;    Family History  Problem Relation Age of Onset   Allergies Mother    Clotting disorder Father    Allergies Brother     Social History   Tobacco Use   Smoking status: Former    Current packs/day: 1.00    Average packs/day: 1 pack/day for 12.0 years (12.0 ttl pk-yrs)    Types: Cigarettes   Smokeless tobacco: Never  Vaping Use   Vaping status: Never Used  Substance Use Topics   Alcohol use: Yes    Comment: occasionally    Drug use: No     Current Outpatient Medications:    atorvastatin (LIPITOR) 20 MG tablet, Take 20 mg by mouth daily., Disp: , Rfl:    Azelastine-Fluticasone 137-50 MCG/ACT SUSP, Place 1 spray into the nose every 12 (twelve) hours., Disp: 23 g, Rfl: 3   baclofen (LIORESAL) 10 MG tablet, TAKE 1 TABLET BY MOUTH AT BEDTIME AS NEEDED FOR MUSCLE SPASMS., Disp: 90 tablet, Rfl: 1   benazepril (LOTENSIN) 40 MG tablet, Take by mouth., Disp: , Rfl:    buPROPion (WELLBUTRIN XL) 300 MG 24 hr tablet, Take 1 tablet (  300 mg total) by mouth daily., Disp: 90 tablet, Rfl: 1   doxycycline (VIBRA-TABS) 100 MG tablet, Take 1 tablet (100 mg total) by mouth 2 (two) times daily., Disp: 20 tablet, Rfl: 0   eletriptan (RELPAX) 20 MG tablet, TAKE 1 TABLET BY MOUTH AS NEEDED FOR MIGRAINE OR HEADACHE. MAY REPEAT IN 2 HRS IF PERSISTS OR RECURS, Disp: 12 tablet,  Rfl: 5   hydroxychloroquine (PLAQUENIL) 200 MG tablet, Take 400 mg by mouth daily., Disp: , Rfl:    levothyroxine (SYNTHROID) 50 MCG tablet, Take 50 mcg by mouth daily., Disp: , Rfl:    mycophenolate (CELLCEPT) 500 MG tablet, Take 1,000 mg by mouth 2 (two) times daily., Disp: , Rfl:    Nutritional Supplements (DHEA PO), Take 1 capsule by mouth daily., Disp: , Rfl:    tadalafil (CIALIS) 5 MG tablet, Take 0.5-1 tablets (2.5-5 mg total) by mouth daily., Disp: 90 tablet, Rfl: 1   triamcinolone cream (KENALOG) 0.1 %, Apply 1 Application topically 2 (two) times daily as needed., Disp: 45 g, Rfl: 1   methylphenidate 18 MG PO CR tablet, Take 18 mg by mouth every morning. (Patient not taking: Reported on 04/01/2023), Disp: , Rfl:   No Known Allergies  ROS: See pertinent positives and negatives per HPI.  OBSERVATIONS/OBJECTIVE:  VITALS per patient if applicable: Today's Vitals   04/01/23 1019  Weight: 200 lb (90.7 kg)  Height: 6' (1.829 m)   Body mass index is 27.12 kg/m.    GENERAL: Alert and oriented. Appears well and in no acute distress.  HEENT: Atraumatic. Conjunctiva clear. No obvious abnormalities on inspection of external nose and ears.  NECK: Normal movements of the head and neck.  LUNGS: On inspection, no signs of respiratory distress. Breathing rate appears normal. No obvious gross SOB, gasping or wheezing, and no conversational dyspnea.  CV: No obvious cyanosis.  MS: Moves all visible extremities without noticeable abnormality.  PSYCH/NEURO: Pleasant and cooperative. No obvious depression or anxiety. Speech and thought processing grossly intact.  ASSESSMENT AND PLAN:  Problem List Items Addressed This Visit   None Visit Diagnoses       Acute non-recurrent frontal sinusitis    -  Primary   Start doxycycline 100mg  BID x10 days. Encourage fluids, rest. Can use flonase, nasal saline spray. F/U if not improving   Relevant Medications   doxycycline (VIBRA-TABS) 100 MG  tablet     Jaw pain       Intermitent, may be r/t stress, teeth grinidng. Can use baclofen as needed and warm compresses.        I discussed the assessment and treatment plan with the patient. The patient was provided an opportunity to ask questions and all were answered. The patient agreed with the plan and demonstrated an understanding of the instructions.   The patient was advised to call back or seek an in-person evaluation if the symptoms worsen or if the condition fails to improve as anticipated.   Gerre Scull, NP

## 2023-05-01 ENCOUNTER — Encounter: Payer: Self-pay | Admitting: Physician Assistant

## 2023-05-01 ENCOUNTER — Other Ambulatory Visit (INDEPENDENT_AMBULATORY_CARE_PROVIDER_SITE_OTHER)

## 2023-05-01 ENCOUNTER — Other Ambulatory Visit: Payer: Self-pay | Admitting: Physician Assistant

## 2023-05-01 DIAGNOSIS — N529 Male erectile dysfunction, unspecified: Secondary | ICD-10-CM

## 2023-05-01 DIAGNOSIS — E890 Postprocedural hypothyroidism: Secondary | ICD-10-CM

## 2023-05-02 ENCOUNTER — Other Ambulatory Visit: Payer: Self-pay | Admitting: Physician Assistant

## 2023-05-02 ENCOUNTER — Encounter: Payer: Self-pay | Admitting: Physician Assistant

## 2023-05-02 DIAGNOSIS — E890 Postprocedural hypothyroidism: Secondary | ICD-10-CM

## 2023-05-02 LAB — T4, FREE: Free T4: 1.4 ng/dL (ref 0.8–1.8)

## 2023-05-02 LAB — TESTOSTERONE TOTAL,FREE,BIO, MALES
Albumin: 4.5 g/dL (ref 3.6–5.1)
Sex Hormone Binding: 25 nmol/L (ref 10–50)
Testosterone: 192 ng/dL — ABNORMAL LOW (ref 250–827)

## 2023-05-02 LAB — TSH: TSH: 1.18 m[IU]/L (ref 0.40–4.50)

## 2023-05-02 MED ORDER — LEVOTHYROXINE SODIUM 50 MCG PO TABS: 50.0000 ug | ORAL_TABLET | Freq: Every day | ORAL | 2 refills | Status: DC

## 2023-06-19 ENCOUNTER — Other Ambulatory Visit: Payer: Self-pay | Admitting: Physician Assistant

## 2023-06-19 DIAGNOSIS — F909 Attention-deficit hyperactivity disorder, unspecified type: Secondary | ICD-10-CM

## 2023-07-07 ENCOUNTER — Other Ambulatory Visit: Payer: Self-pay | Admitting: Physician Assistant

## 2023-07-07 DIAGNOSIS — G43109 Migraine with aura, not intractable, without status migrainosus: Secondary | ICD-10-CM

## 2023-07-27 ENCOUNTER — Other Ambulatory Visit: Payer: Self-pay | Admitting: Physician Assistant

## 2023-07-27 DIAGNOSIS — N529 Male erectile dysfunction, unspecified: Secondary | ICD-10-CM

## 2023-07-28 ENCOUNTER — Encounter: Payer: Self-pay | Admitting: Physician Assistant

## 2023-08-04 ENCOUNTER — Other Ambulatory Visit: Payer: Self-pay | Admitting: Physician Assistant

## 2023-08-04 DIAGNOSIS — G43109 Migraine with aura, not intractable, without status migrainosus: Secondary | ICD-10-CM

## 2023-08-06 NOTE — Progress Notes (Unsigned)
      Established patient visit   Patient: Timothy Best   DOB: [birth year]   48 y.o. Male  MRN: [id number] Visit Date: 08/07/2023  Today's healthcare provider: Manuelita Flatness, PA-C   No chief complaint on file.  Subjective     ***  Medications: Outpatient Medications Prior to Visit  Medication Sig   atorvastatin (LIPITOR) 20 MG tablet Take 20 mg by mouth daily.   Azelastine -Fluticasone  137-50 MCG/ACT SUSP Place 1 spray into the nose every 12 (twelve) hours.   baclofen  (LIORESAL ) 10 MG tablet TAKE 1 TABLET BY MOUTH AT BEDTIME AS NEEDED FOR MUSCLE SPASMS.   benazepril (LOTENSIN) 40 MG tablet Take by mouth.   buPROPion  (WELLBUTRIN  XL) 300 MG 24 hr tablet TAKE 1 TABLET BY MOUTH EVERY DAY   doxycycline  (VIBRA -TABS) 100 MG tablet Take 1 tablet (100 mg total) by mouth 2 (two) times daily.   eletriptan  (RELPAX ) 20 MG tablet TAKE 1 TABLET BY MOUTH AS NEEDED FOR MIGRAINE OR HEADACHE. MAY REPEAT IN 2 HRS IF PERSISTS OR RECURS   hydroxychloroquine (PLAQUENIL) 200 MG tablet Take 400 mg by mouth daily.   levothyroxine  (SYNTHROID ) 50 MCG tablet Take 1 tablet (50 mcg total) by mouth daily.   methylphenidate 18 MG PO CR tablet Take 18 mg by mouth every morning. (Patient not taking: Reported on 04/01/2023)   mycophenolate (CELLCEPT) 500 MG tablet Take 1,000 mg by mouth 2 (two) times daily.   Nutritional Supplements (DHEA PO) Take 1 capsule by mouth daily.   tadalafil  (CIALIS ) 5 MG tablet Take 0.5-1 tablets (2.5-5 mg total) by mouth daily.   triamcinolone  cream (KENALOG ) 0.1 % Apply 1 Application topically 2 (two) times daily as needed.   No facility-administered medications prior to visit.    Review of Systems {Insert previous labs (optional):23779} {See past labs  Heme  Chem  Endocrine  Serology  Results Review (optional):1}   Objective    There were no vitals taken for this visit. {Insert last BP/Wt (optional):23777}{See vitals history (optional):1}  Physical Exam  ***  No  results found for any visits on 08/07/23.  Assessment & Plan    There are no diagnoses linked to this encounter.  ***  No follow-ups on file.       Manuelita Flatness, PA-C  [facility] at [facility] [phone] (phone) [phone] (fax)  [facility]

## 2023-08-07 ENCOUNTER — Ambulatory Visit: Admitting: Physician Assistant

## 2023-08-07 ENCOUNTER — Encounter: Payer: Self-pay | Admitting: Physician Assistant

## 2023-08-07 VITALS — BP 137/87

## 2023-08-07 VITALS — BP 122/70 | HR 109 | Ht 72.0 in | Wt 183.2 lb

## 2023-08-07 DIAGNOSIS — G43109 Migraine with aura, not intractable, without status migrainosus: Secondary | ICD-10-CM

## 2023-08-07 DIAGNOSIS — L989 Disorder of the skin and subcutaneous tissue, unspecified: Secondary | ICD-10-CM | POA: Diagnosis not present

## 2023-08-07 DIAGNOSIS — D485 Neoplasm of uncertain behavior of skin: Secondary | ICD-10-CM

## 2023-08-07 DIAGNOSIS — L219 Seborrheic dermatitis, unspecified: Secondary | ICD-10-CM | POA: Diagnosis not present

## 2023-08-07 DIAGNOSIS — I10 Essential (primary) hypertension: Secondary | ICD-10-CM

## 2023-08-07 DIAGNOSIS — F909 Attention-deficit hyperactivity disorder, unspecified type: Secondary | ICD-10-CM | POA: Diagnosis not present

## 2023-08-07 DIAGNOSIS — L821 Other seborrheic keratosis: Secondary | ICD-10-CM | POA: Diagnosis not present

## 2023-08-07 DIAGNOSIS — D492 Neoplasm of unspecified behavior of bone, soft tissue, and skin: Secondary | ICD-10-CM

## 2023-08-07 DIAGNOSIS — N529 Male erectile dysfunction, unspecified: Secondary | ICD-10-CM | POA: Diagnosis not present

## 2023-08-07 MED ORDER — TADALAFIL 5 MG PO TABS: 2.5000 mg | ORAL_TABLET | Freq: Every day | ORAL | 1 refills | Status: DC

## 2023-08-07 MED ORDER — CYCLOBENZAPRINE HCL 10 MG PO TABS: 5.0000 mg | ORAL_TABLET | Freq: Every day | ORAL | 0 refills | Status: DC

## 2023-08-07 MED ORDER — KETOCONAZOLE 2 % EX SHAM: 1.0000 | MEDICATED_SHAMPOO | CUTANEOUS | 6 refills | Status: DC

## 2023-08-07 MED ORDER — BUPROPION HCL ER (XL) 150 MG PO TB24: 150.0000 mg | ORAL_TABLET | Freq: Every day | ORAL | 3 refills | Status: DC

## 2023-08-07 NOTE — Patient Instructions (Signed)

## 2023-08-07 NOTE — Progress Notes (Signed)
   New Patient Visit   Subjective  Timothy Best is a 48 y.o. male who presents for the following: Mole of left abdomen that has gotten bigger.   Also has a rash that comes and goes on his mid chest that has been present for many many years. Has had biopsy in [place], [place] many years ago which was indeterminate. Does use triamcinolone  which does not seem to do much.    The following portions of the chart were reviewed this encounter and updated as appropriate: medications, allergies, medical history  Review of Systems:  No other skin or systemic complaints except as noted in HPI or Assessment and Plan.  Objective  Well appearing patient in no apparent distress; mood and affect are within normal limits.  A focused examination was performed of the following areas:   Relevant exam findings are noted in the Assessment and Plan.   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  It tends to be exacerbated by stress and cooler weather.  People who have neurologic disease may experience new onset or exacerbation of existing seborrheic dermatitis.  The condition is not curable but treatable and can be controlled.  Treatment Plan: Ketoconazole 2% shampoo Wash trunk daily until clear then weekly for prevention.      NEOPLASM OF UNCERTAIN BEHAVIOR OF SKIN Left Abdomen (side) - Lower Epidermal / dermal shaving  Lesion diameter (cm):  1.2 Informed consent: discussed and consent obtained   Timeout: patient name, date of birth, surgical site, and procedure verified   Procedure prep:  Patient was prepped and draped in usual sterile fashion Prep type:   Isopropyl alcohol Anesthesia: the lesion was anesthetized in a standard fashion   Anesthetic:  1% lidocaine  w/ epinephrine  1-100,000 buffered w/ 8.4% NaHCO3 Instrument used: flexible razor blade   Hemostasis achieved with: pressure, aluminum chloride and electrodesiccation   Outcome: patient tolerated procedure well   Post-procedure details: sterile dressing applied and wound care instructions given   Dressing type: bandage and petrolatum    Specimen 1 - Surgical pathology Differential Diagnosis: SK vs dysplastic nevus vs other  Check Margins: No SEBORRHEIC DERMATITIS    Return if symptoms worsen or fail to improve.  I, Roseline Hutchinson, CMA, am acting as scribe for Toluwani Ruder K, PA-C .   Documentation: I have reviewed the above documentation for accuracy and completeness, and I agree with the above.  Rithik Odea K, PA-C

## 2023-08-11 ENCOUNTER — Other Ambulatory Visit (HOSPITAL_COMMUNITY)
Admission: RE | Admit: 2023-08-11 | Discharge: 2023-08-11 | Disposition: A | Discharge status: Home / Self Care | Source: Ambulatory Visit | Attending: Medical | Admitting: Medical

## 2023-08-11 ENCOUNTER — Encounter: Payer: Self-pay | Admitting: Physician Assistant

## 2023-08-11 ENCOUNTER — Encounter: Payer: Self-pay | Admitting: Medical

## 2023-08-11 ENCOUNTER — Ambulatory Visit: Admitting: Medical

## 2023-08-11 VITALS — BP 140/82 | HR 79 | Temp 98.1°F | Resp 16 | Ht 72.0 in | Wt 186.6 lb

## 2023-08-11 DIAGNOSIS — R369 Urethral discharge, unspecified: Secondary | ICD-10-CM

## 2023-08-11 DIAGNOSIS — Z113 Encounter for screening for infections with a predominantly sexual mode of transmission: Secondary | ICD-10-CM | POA: Diagnosis present

## 2023-08-11 DIAGNOSIS — R300 Dysuria: Secondary | ICD-10-CM

## 2023-08-11 DIAGNOSIS — R350 Frequency of micturition: Secondary | ICD-10-CM

## 2023-08-11 LAB — POC URINALSYSI DIPSTICK (AUTOMATED)
Bilirubin, UA: NEGATIVE
Blood, UA: NEGATIVE
Glucose, UA: NEGATIVE
Ketones, UA: NEGATIVE
Leukocytes, UA: NEGATIVE
Nitrite, UA: NEGATIVE
Protein, UA: POSITIVE — AB
Spec Grav, UA: 1.01 (ref 1.010–1.025)
Urobilinogen, UA: 0.2 U/dL
pH, UA: 5 (ref 5.0–8.0)

## 2023-08-11 LAB — SURGICAL PATHOLOGY

## 2023-08-11 NOTE — Addendum Note (Signed)
 Addended by: ESTELLE GILLIS D on: 08/11/2023 04:50 PM   Modules accepted: Orders

## 2023-08-11 NOTE — Patient Instructions (Signed)
 Discharge described may be spermaturia. Overall with signs/symptoms consider prostatitis, retrograde ejaculation or post coital.Below lab to evaluate differential. Treatment to be determined after lab review. May refer to urologist as well.  1. Discharge from penis (Primary) -clear type described along line of sperm like per description - Urine cytology ancillary only(G, C and trich) - Urine Culture  2. Dysuria - Urine Culture - POCT Urinalysis Dipstick (Automated)  3. Screening examination for STD (sexually transmitted disease) - Urine cytology ancillary only. -G & C, Trichomonas.  4. Screen for STD (sexually transmitted disease) - HIV antibody (with reflex) - RPR   Follow up date to be determined.

## 2023-08-11 NOTE — Progress Notes (Signed)
 Subjective:    Patient ID: Timothy Best, male    DOB: [birth year], 48 y.o.   MRN: [id number]  HPI  Timothy Best is a 48 year old male with chronic kidney disease who presents with urethral discharge and urinary symptoms.  He noticed a urethral discharge starting last Thursday, initially observing a small string-like discharge while using the restroom(similar to sperm possibly). This morning, he noticed three significant blobs of cloudy white discharge, resembling semen, after urination. The discharge is described as 'a clear kind of whitish, almost like sperm discharge' with no yellow or brown coloration. No testicle dc. No rash to penis. No ulcers.  He experiences a burning sensation in the urethra, which he associates with a possible urinary tract infection. He notes that he has never experienced discharge with previous UTIs. No testicular tenderness, rash, or blisters. The burning sensation is not consistent with every urination, as he did not notice it during subsequent urinations today.  His wife has also mentioned experiencing excessive discharge recently, but without any odor. He was intimate two nights ago, and he did not notice anything unusual at that time. He confirms that he only has sexual relations with his wife.  He mentions a slower urinary stream, with increased frequency and hesitancy over the past month, with a weaker stream than ten years ago. He also notes getting up frequently at night to urinate, although he does not consider his urinary frequency to be overly frequent, given his chronic kidney disease.(but on review gfr in past that I see is over 60)   Review of Systems  Constitutional:  Negative for chills, fatigue and fever.  Respiratory:  Negative for chest tightness, shortness of breath and wheezing.   Cardiovascular:  Negative for chest pain and palpitations.  Gastrointestinal:  Negative for abdominal pain, constipation and diarrhea.  Genitourinary:         See hpi  Musculoskeletal:  Negative for back pain, neck pain and neck stiffness.  Skin:  Negative for rash.  Neurological:  Negative for dizziness and headaches.  Hematological:  Negative for adenopathy. Does not bruise/bleed easily.  Psychiatric/Behavioral:  Negative for behavioral problems and decreased concentration.    Past Medical History:  Diagnosis Date   Dermatitis    Erectile dysfunction    Glomerulonephritis    Hyperlipidemia    Hypertension    Pneumonia    Proteinuria    Systemic lupus (HCC)      Social History   Socioeconomic History   Marital status: Married    Spouse name: Not on file   Number of children: Not on file   Years of education: Not on file   Highest education level: Not on file  Occupational History   Not on file  Tobacco Use   Smoking status: Former    Current packs/day: 1.00    Average packs/day: 1 pack/day for 12.0 years (12.0 ttl pk-yrs)    Types: Cigarettes   Smokeless tobacco: Never  Vaping Use   Vaping status: Never Used  Substance and Sexual Activity   Alcohol use: Yes    Comment: occasionally    Drug use: No   Sexual activity: Not on file  Other Topics Concern   Not on file  Social History Narrative   Not on file   Social Drivers of Health   Financial Resource Strain: Not on file  Food Insecurity: Not on file  Transportation Needs: Not on file  Physical Activity: Not on file  Stress: Not on  file  Social Connections: Not on file  Intimate Partner Violence: Not on file    Past Surgical History:  Procedure Laterality Date   NO PAST SURGERIES     RENAL BIOPSY     THYROIDECTOMY Left 12/22/2018   Procedure: HEMI THYROIDECTOMY;  Surgeon: Milissa Hamming, MD;  Location: [facility] ORS;  Service: ENT;  Laterality: Left;    Family History  Problem Relation Age of Onset   Allergies Mother    Clotting disorder Father    Allergies Brother     No Known Allergies  Current Outpatient Medications on File Prior to Visit   Medication Sig Dispense Refill   atorvastatin (LIPITOR) 20 MG tablet Take 20 mg by mouth daily.     Azelastine -Fluticasone  137-50 MCG/ACT SUSP Place 1 spray into the nose every 12 (twelve) hours. 23 g 3   benazepril (LOTENSIN) 40 MG tablet Take by mouth.     buPROPion  (WELLBUTRIN  XL) 150 MG 24 hr tablet Take 1 tablet (150 mg total) by mouth daily. 30 tablet 3   cyclobenzaprine  (FLEXERIL ) 10 MG tablet Take 0.5-1 tablets (5-10 mg total) by mouth at bedtime. 30 tablet 0   eletriptan  (RELPAX ) 20 MG tablet TAKE 1 TABLET BY MOUTH AS NEEDED FOR MIGRAINE OR HEADACHE. MAY REPEAT IN 2 HRS IF PERSISTS OR RECURS 12 tablet 0   hydroxychloroquine (PLAQUENIL) 200 MG tablet Take 400 mg by mouth daily.     ketoconazole  (NIZORAL ) 2 % shampoo Apply 1 Application topically as directed. Wash affected area daily until clear then weekly for prevention 120 mL 6   levothyroxine  (SYNTHROID ) 50 MCG tablet Take 1 tablet (50 mcg total) by mouth daily. 90 tablet 2   methylphenidate 18 MG PO CR tablet Take 18 mg by mouth every morning.     mycophenolate (CELLCEPT) 500 MG tablet Take 1,000 mg by mouth 2 (two) times daily.     Nutritional Supplements (DHEA PO) Take 1 capsule by mouth daily.     tadalafil  (CIALIS ) 5 MG tablet Take 0.5-1 tablets (2.5-5 mg total) by mouth daily. 90 tablet 1   triamcinolone  cream (KENALOG ) 0.1 % Apply 1 Application topically 2 (two) times daily as needed. 45 g 1   No current facility-administered medications on file prior to visit.    BP (!) 140/82 (BP Location: Left Arm, Patient Position: Sitting, Cuff Size: Normal)   Pulse 79   Temp 98.1 F (36.7 C) (Oral)   Resp 16   Ht 6' (1.829 m)   Wt 186 lb 9.6 oz (84.6 kg)   SpO2 99%   BMI 25.31 kg/m        Objective:   Physical Exam  General Mental Status- Alert. General Appearance- Not in acute distress.     Neck  No JVD.  Chest and Lung Exam Auscultation: Breath Sounds:-Normal.  Cardiovascular Auscultation:Rythm-  Regular. Murmurs & Other Heart Sounds:Auscultation of the heart reveals- No Murmurs.  Abdomen Inspection:-Inspeection Normal. Palpation/Percussion:Note:No mass. Palpation and Percussion of the abdomen reveal- Non Tender, Non Distended + BS, no rebound or guarding.   Neurologic Cranial Nerve exam:- CN III-XII intact(No nystagmus), symmetric smile. Strength:- 5/5 equal and symmetric strength both upper and lower extremities.   Genital exam- deferred.    Assessment & Plan:  Discharge describe may be spermaturia. Overall with signs/symptoms consider prostatitis, retrograde ejaculation or post coital below lab to evaluate differential. Treatment to be determined after lab review. May refer to urologist as well.   1. Discharge from penis (Primary) -clear type described along  line of sperm like per description - Urine cytology ancillary only(G, C and trich) - Urine Culture  2. Dysuria - Urine Culture - POCT Urinalysis Dipstick (Automated)  3. Screening examination for STD (sexually transmitted disease) - Urine cytology ancillary only. -G & C, Trichomonas.  4. Screen for STD (sexually transmitted disease) - HIV antibody (with reflex) - RPR   Follow up date to be determined.  Cecilia Vancleve, PA-C

## 2023-08-12 ENCOUNTER — Ambulatory Visit: Payer: Self-pay | Admitting: Medical

## 2023-08-12 ENCOUNTER — Ambulatory Visit: Payer: Self-pay | Admitting: Physician Assistant

## 2023-08-12 LAB — URINE CULTURE
MICRO NUMBER:: 16665396
Result:: NO GROWTH
SPECIMEN QUALITY:: ADEQUATE

## 2023-08-12 LAB — HIV ANTIBODY (ROUTINE TESTING W REFLEX): HIV 1&2 Ab, 4th Generation: NONREACTIVE

## 2023-08-12 LAB — PSA: PSA: 1.3 ng/mL (ref <=4.00)

## 2023-08-12 LAB — RPR: RPR Ser Ql: NONREACTIVE

## 2023-08-12 NOTE — Telephone Encounter (Signed)
 No further action needed at this time.

## 2023-08-13 LAB — URINE CYTOLOGY ANCILLARY ONLY
Chlamydia: NEGATIVE
Comment: NEGATIVE
Comment: NEGATIVE
Comment: NORMAL
Neisseria Gonorrhea: NEGATIVE
Trichomonas: NEGATIVE

## 2023-08-29 ENCOUNTER — Other Ambulatory Visit: Payer: Self-pay | Admitting: Physician Assistant

## 2023-08-29 DIAGNOSIS — G43109 Migraine with aura, not intractable, without status migrainosus: Secondary | ICD-10-CM

## 2023-08-30 ENCOUNTER — Other Ambulatory Visit: Payer: Self-pay | Admitting: Physician Assistant

## 2023-08-30 DIAGNOSIS — F909 Attention-deficit hyperactivity disorder, unspecified type: Secondary | ICD-10-CM

## 2023-09-01 ENCOUNTER — Encounter: Admitting: Urology

## 2023-09-19 ENCOUNTER — Other Ambulatory Visit: Payer: Self-pay | Admitting: Physician Assistant

## 2023-09-19 DIAGNOSIS — F909 Attention-deficit hyperactivity disorder, unspecified type: Secondary | ICD-10-CM

## 2023-09-19 NOTE — Telephone Encounter (Signed)
 Rx for bupropion  xl 300mg  denied, dose changed to 150mg  and Rx was sent on 09/01/23

## 2023-09-23 ENCOUNTER — Telehealth: Payer: Self-pay

## 2023-09-23 NOTE — Telephone Encounter (Signed)
 Pt has an appt on 10/21/23 with Eleanor Ponto , NP can you check on this ?    Copied from CRM [id number]. Topic: Appointments - Transfer of Care >> Sep 23, 2023 11:58 AM Henretta I wrote: Pt is requesting to transfer FROM: Manuelita Flatness  Pt is requesting to transfer TO: Eleanor Ponto  Reason for requested transfer: Dr. Flatness is leaving practice  It is the responsibility of the team the patient would like to transfer to (Dr. Eleanor Ponto ) to reach out to the patient if for any reason this transfer is not acceptable.

## 2023-10-15 ENCOUNTER — Other Ambulatory Visit: Payer: Self-pay | Admitting: Medical Genetics

## 2023-10-21 ENCOUNTER — Ambulatory Visit: Admitting: Family

## 2023-10-21 ENCOUNTER — Encounter: Payer: Self-pay | Admitting: Family

## 2023-10-21 VITALS — BP 126/69 | HR 72 | Temp 97.8°F | Resp 16 | Ht 72.0 in | Wt 186.4 lb

## 2023-10-21 DIAGNOSIS — E890 Postprocedural hypothyroidism: Secondary | ICD-10-CM

## 2023-10-21 DIAGNOSIS — R918 Other nonspecific abnormal finding of lung field: Secondary | ICD-10-CM

## 2023-10-21 DIAGNOSIS — Z23 Encounter for immunization: Secondary | ICD-10-CM

## 2023-10-21 DIAGNOSIS — G2581 Restless legs syndrome: Secondary | ICD-10-CM | POA: Diagnosis not present

## 2023-10-21 DIAGNOSIS — E782 Mixed hyperlipidemia: Secondary | ICD-10-CM

## 2023-10-21 DIAGNOSIS — N529 Male erectile dysfunction, unspecified: Secondary | ICD-10-CM

## 2023-10-21 DIAGNOSIS — L2082 Flexural eczema: Secondary | ICD-10-CM

## 2023-10-21 DIAGNOSIS — M3214 Glomerular disease in systemic lupus erythematosus: Secondary | ICD-10-CM

## 2023-10-21 DIAGNOSIS — F909 Attention-deficit hyperactivity disorder, unspecified type: Secondary | ICD-10-CM

## 2023-10-21 DIAGNOSIS — G4733 Obstructive sleep apnea (adult) (pediatric): Secondary | ICD-10-CM

## 2023-10-21 DIAGNOSIS — E010 Iodine-deficiency related diffuse (endemic) goiter: Secondary | ICD-10-CM

## 2023-10-21 DIAGNOSIS — I10 Essential (primary) hypertension: Secondary | ICD-10-CM

## 2023-10-21 LAB — TSH: TSH: 0.76 m[IU]/L (ref 0.40–4.50)

## 2023-10-21 MED ORDER — ROPINIROLE HCL 0.25 MG PO TABS: 0.2500 mg | ORAL_TABLET | Freq: Every day | ORAL | 3 refills | Status: DC

## 2023-10-21 NOTE — Assessment & Plan Note (Signed)
 Has taken Concerta in the past.  Stopped because of refill issues.

## 2023-10-21 NOTE — Assessment & Plan Note (Signed)
 Dr. Saralee Stank Sneads Kid[facility].

## 2023-10-21 NOTE — Assessment & Plan Note (Signed)
 Stable when he is doing well on his immune suppression.

## 2023-10-21 NOTE — Assessment & Plan Note (Signed)
 Noted in 2020 was told lung infection, resolved on follow up CXR.

## 2023-10-21 NOTE — Patient Instructions (Signed)
 VISIT SUMMARY:  You had a follow-up visit to manage your idiopathic glomerulonephritis and other health conditions. Your proteinuria is well-controlled, and we discussed new treatment options for your restless leg syndrome. We also reviewed your management plan for sleep apnea, hypothyroidism, hypertension, and erectile dysfunction.  YOUR PLAN:  IDIOPATHIC GLOMERULONEPHRITIS: Your condition is well-managed with immunosuppressive therapy, and your proteinuria is significantly reduced. -Continue taking CellCept and Plaquenil as directed by your nephrologist.  RESTLESS LEGS SYNDROME: You experience significant discomfort from restless legs syndrome. -Start taking Requip  (ropinirole ) one hour before bedtime. -Monitor your response to Requip  and we will adjust the dosage as needed.  OBSTRUCTIVE SLEEP APNEA: Your sleep apnea is managed with a mandibular advancement device, and weight loss has improved your symptoms. -Continue using the mandibular advancement device as needed.  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 -Get a T[facility] test done at [facility]. -Get an ultrasound of your right thyroid  lobe.  HYPERTENSION: Your blood pressure is well-controlled with benazepril. -Continue taking benazepril 40 mg as prescribed.  ERECTILE DYSFUNCTION: Your erectile dysfunction is managed with Cialis , which you find beneficial. -Continue taking Cialis  as needed.  GENERAL HEALTH MAINTENANCE: We discussed vaccines and scheduled a colonoscopy consult. -Get your flu and shingles vaccines. -Consider getting the hepatitis B vaccination at your next visit. -Your colonoscopy consult is scheduled for October 30th.

## 2023-10-21 NOTE — Progress Notes (Signed)
 Subjective:     Patient ID: Timothy Best, male    DOB: 11/30/1975, 48 y.o.   MRN: [id number]  Chief Complaint  Patient presents with   Transitions Of Care    HPI  Discussed the use of AI scribe software for clinical note transcription with the patient, who gave verbal consent to proceed.  History of Present Illness Timothy Best is a 48 year old male with idiopathic glomerulonephritis who presents for a routine follow-up and medication management.  He was diagnosed with idiopathic glomerulonephritis in 2012, initially presenting with significant edema and proteinuria. Current proteinuria is under one gram, managed with CellCept and Plaquenil. A second kidney biopsy confirmed idiopathic glomerulonephritis. He consistently tests positive for Sjogren's syndrome.  He has sleep apnea, initially diagnosed after migraines and snoring. He uses a mandibular advancement device occasionally and has lost 15 pounds, which has improved symptoms. His wife notices less snoring unless he consumes alcohol or cannabis. No significant daytime sleepiness is present.  He experiences restless leg syndrome, described as 'doing karate in the middle of the night,' with limited success from gabapentin and Flexeril . He is concerned about gabapentin's side effects and finds Flexeril  too sedating.  He underwent a partial thyroid  lobectomy for a goiter and is on levothyroxine . He has eczema that flares when immune suppression is suboptimal, improving with controlled proteinuria.  He has managed ADHD without medication for 50 years. Low testosterone  is managed with diet and exercise, with symptom improvement. High cholesterol is well-managed with atorvastatin. Migraines have significantly improved since addressing sleep apnea and weight loss.      Health Maintenance Due  Topic Date Due   Hepatitis C Screening  Never done   Hepatitis B Vaccines 19-59 Average Risk (1 of 3 - 19+ 3-dose series) Never done    COLON CANCER SCREENING ANNUAL FOBT  05/01/2022   COVID-19 Vaccine (6 - Pfizer risk 2024-25 season) 10/06/2023    Past Medical History:  Diagnosis Date   Dermatitis    Erectile dysfunction    Glomerulonephritis    Hyperlipidemia    Hypertension    Pneumonia    Proteinuria    Systemic lupus (HCC)     Past Surgical History:  Procedure Laterality Date   NO PAST SURGERIES     RENAL BIOPSY     THYROIDECTOMY Left 12/22/2018   Procedure: HEMI THYROIDECTOMY;  Surgeon: Milissa Hamming, MD;  Location: [facility] ORS;  Service: ENT;  Laterality: Left;    Family History  Problem Relation Age of Onset   Allergies Mother    Clotting disorder Father    Allergies Brother     Social History   Socioeconomic History   Marital status: Married    Spouse name: Not on file   Number of children: Not on file   Years of education: Not on file   Highest education level: Associate degree: occupational, Scientist, product/process development, or vocational program  Occupational History   Not on file  Tobacco Use   Smoking status: Former    Current packs/day: 1.00    Average packs/day: 1 pack/day for 12.0 years (12.0 ttl pk-yrs)    Types: Cigarettes   Smokeless tobacco: Never  Vaping Use   Vaping status: Never Used  Substance and Sexual Activity   Alcohol use: Yes    Comment: occasionally    Drug use: No   Sexual activity: Not on file  Other Topics Concern   Not on file  Social History Lawyer for [employer]  Married (second Marriage)   2 grown children first marriage   1 child born 2020   Social Drivers of Health   Financial Resource Strain: Medium Risk (10/20/2023)   Overall Financial Resource Strain (CARDIA)    Difficulty of Paying Living Expenses: Somewhat hard  Food Insecurity: No Food Insecurity (10/20/2023)   Hunger Vital Sign    Worried About Running Out of Food in the Last Year: Never true    Ran Out of Food in the Last Year: Never true  Transportation Needs: No Transportation Needs  (10/20/2023)   PRAPARE - Administrator, Civil Service (Medical): No    Lack of Transportation (Non-Medical): No  Physical Activity: Sufficiently Active (10/20/2023)   Exercise Vital Sign    Days of Exercise per Week: 5 days    Minutes of Exercise per Session: 50 min  Stress: Stress Concern Present (10/20/2023)   [employer] of Occupational Health - Occupational Stress Questionnaire    Feeling of Stress: To some extent  Social Connections: Unknown (10/20/2023)   Social Connection and Isolation Panel    Frequency of Communication with Friends and Family: Patient declined    Frequency of Social Gatherings with Friends and Family: Patient declined    Attends Religious Services: Never    Database administrator or Organizations: No    Attends Engineer, structural: Not on file    Marital Status: Married  Catering manager Violence: Not on file    Outpatient Medications Prior to Visit  Medication Sig Dispense Refill   atorvastatin (LIPITOR) 20 MG tablet Take 20 mg by mouth daily.     Azelastine -Fluticasone  137-50 MCG/ACT SUSP Place 1 spray into the nose every 12 (twelve) hours. 23 g 3   benazepril (LOTENSIN) 40 MG tablet Take by mouth.     cyclobenzaprine  (FLEXERIL ) 10 MG tablet Take 0.5-1 tablets (5-10 mg total) by mouth at bedtime. 30 tablet 0   eletriptan  (RELPAX ) 20 MG tablet TAKE 1 TABLET BY MOUTH AS NEEDED FOR MIGRAINE OR HEADACHE. MAY REPEAT IN 2 HRS IF PERSISTS OR RECURS 12 tablet 0   hydroxychloroquine (PLAQUENIL) 200 MG tablet Take 400 mg by mouth daily.     ketoconazole  (NIZORAL ) 2 % shampoo Apply 1 Application topically as directed. Wash affected area daily until clear then weekly for prevention 120 mL 6   levothyroxine  (SYNTHROID ) 50 MCG tablet Take 1 tablet (50 mcg total) by mouth daily. 90 tablet 2   mycophenolate (CELLCEPT) 500 MG tablet Take 1,000 mg by mouth 2 (two) times daily.     Nutritional Supplements (DHEA PO) Take 1 capsule by mouth daily.      tadalafil  (CIALIS ) 5 MG tablet Take 0.5-1 tablets (2.5-5 mg total) by mouth daily. 90 tablet 1   triamcinolone  cream (KENALOG ) 0.1 % Apply 1 Application topically 2 (two) times daily as needed. 45 g 1   buPROPion  (WELLBUTRIN  XL) 150 MG 24 hr tablet Take 1 tablet (150 mg total) by mouth daily. 90 tablet 0   methylphenidate 18 MG PO CR tablet Take 18 mg by mouth every morning.     No facility-administered medications prior to visit.    No Known Allergies  ROS See HPI    Objective:    Physical Exam Constitutional:      General: He is not in acute distress.    Appearance: He is well-developed.  HENT:     Head: Normocephalic and atraumatic.  Neck:     Thyroid : Thyromegaly (right side of thyroid ,  s/p left lobectomy) present.  Cardiovascular:     Rate and Rhythm: Normal rate and regular rhythm.     Heart sounds: No murmur heard. Pulmonary:     Effort: Pulmonary effort is normal. No respiratory distress.     Breath sounds: Normal breath sounds. No wheezing or rales.  Skin:    General: Skin is warm and dry.  Neurological:     Mental Status: He is alert and oriented to person, place, and time.  Psychiatric:        Behavior: Behavior normal.        Thought Content: Thought content normal.      BP 126/69 (BP Location: Right Arm, Patient Position: Sitting, Cuff Size: Normal)   Pulse 72   Temp 97.8 F (36.6 C) (Oral)   Resp 16   Ht 6' (1.829 m)   Wt 186 lb 6.4 oz (84.6 kg)   SpO2 99%   BMI 25.28 kg/m  Wt Readings from Last 3 Encounters:  10/21/23 186 lb 6.4 oz (84.6 kg)  08/11/23 186 lb 9.6 oz (84.6 kg)  08/07/23 183 lb 3.2 oz (83.1 kg)       Assessment & Plan:   Problem List Items Addressed This Visit       Unprioritized   Thyromegaly   Remaining right thyroid  appears enlarged. Will obtain US  for further evaluation as it has not been looked at in about 5 years.      Relevant Orders   TSH   US  THYROID    SLE glomerulonephritis syndrome (HCC) - Primary   Dr.  Saralee Stank [facility].       RLS (restless legs syndrome)   Uncontrolled. Trial of requip .      Relevant Medications   rOPINIRole  (REQUIP ) 0.25 MG tablet   Postoperative hypothyroidism   Lab Results  Component Value Date   TSH 1.18 05/01/2023   Stable on synthroid . Continue same, update TSH.       OSA (obstructive sleep apnea)   Was on cpap did not tolerate.  Using otc mandibular advancement device.  Feels that symptoms are improved with a 15 pound weight loss. Declines repeat sleep study at this time.       RESOLVED: Mass of right lung   Noted in 2020 was told lung infection, resolved on follow up CXR.      Hyperlipidemia    Last lipid panel at goal with nephrology last month:  Cholesterol <200 mg/dL 860   HDL > OR = 40 mg/dL 41   Triglycerides <849 mg/dL 60   LDL Direct mg/dL (calc) 84      Reference range: <100             Desirable range <100 mg/dL for primary prevention;        <70 mg/dL for patients with CHD or diabetic patients      with > or = 2 CHD risk factors.             LDL-C is now calculated using the Martin-Hopkins       calculation, which is a validated novel method providing      better accuracy than the Friedewald equation in the      estimation of LDL-C.      Gladis APPLETHWAITE et al. SANDREA. 7986;689(80): 2061-2068      (http://education.[facility].com/faq/FAQ164)  Chol/HDL Ratio <5.0 (calc) 3.4   Non HDL Cholesterol <130 mg/dL (calc) 98          Essential hypertension  BP Readings from Last 3 Encounters:  10/21/23 126/69  08/11/23 (!) 140/82  08/07/23 137/87   Stable on benazapril 40mg .       Erectile dysfunction   Stable with cialis  prn.       Dermatitis, eczematoid   Stable when he is doing well on his immune suppression.       Attention deficit hyperactivity disorder (ADHD)   Has taken Concerta in the past.  Stopped because of refill issues.       Other Visit Diagnoses       Flu vaccine need       Relevant  Orders   Flu vaccine trivalent PF, 6mos and older(Flulaval,Afluria,Fluarix,Fluzone) (Completed)     Need for shingles vaccine       Relevant Orders   Varicella-zoster vaccine IM (Completed)      I personally spent a total of 40 minutes in the care of the patient today including preparing to see the patient, getting/reviewing separately obtained history, performing a medically appropriate exam/evaluation, placing orders, and documenting clinical information in the EHR.  I have discontinued Jedi B. Kohlmann's methylphenidate and buPROPion . I am also having him start on rOPINIRole . Additionally, I am having him maintain his atorvastatin, hydroxychloroquine, Nutritional Supplements (DHEA PO), mycophenolate, benazepril, Azelastine -Fluticasone , triamcinolone  cream, levothyroxine , tadalafil , cyclobenzaprine , ketoconazole , and eletriptan .  Meds ordered this encounter  Medications   rOPINIRole  (REQUIP ) 0.25 MG tablet    Sig: Take 1 tablet (0.25 mg total) by mouth at bedtime.    Dispense:  30 tablet    Refill:  3    Supervising Provider:   DOMENICA BLACKBIRD A [[id number]]

## 2023-10-21 NOTE — Assessment & Plan Note (Addendum)
 Was on cpap did not tolerate.  Using otc mandibular advancement device.  Feels that symptoms are improved with a 15 pound weight loss. Declines repeat sleep study at this time.

## 2023-10-21 NOTE — Assessment & Plan Note (Signed)
Uncontrolled. Trial of requip.

## 2023-10-21 NOTE — Assessment & Plan Note (Addendum)
 BP Readings from Last 3 Encounters:  10/21/23 126/69  08/11/23 (!) 140/82  08/07/23 137/87   Stable on benazapril 40mg .

## 2023-10-21 NOTE — Assessment & Plan Note (Signed)
 Remaining right thyroid  appears enlarged. Will obtain US  for further evaluation as it has not been looked at in about 5 years.

## 2023-10-21 NOTE — Assessment & Plan Note (Signed)
 Stable with cialis prn.

## 2023-10-21 NOTE — Assessment & Plan Note (Signed)
 Lab Results  Component Value Date   TSH 1.18 05/01/2023   Stable on synthroid . Continue same, update TSH.

## 2023-10-21 NOTE — Assessment & Plan Note (Addendum)
  Last lipid panel at goal with nephrology last month:  Cholesterol <200 mg/dL 860   HDL > OR = 40 mg/dL 41   Triglycerides <849 mg/dL 60   LDL Direct mg/dL (calc) 84      Reference range: <100             Desirable range <100 mg/dL for primary prevention;        <70 mg/dL for patients with CHD or diabetic patients      with > or = 2 CHD risk factors.             LDL-C is now calculated using the Martin-Hopkins       calculation, which is a validated novel method providing      better accuracy than the Friedewald equation in the      estimation of LDL-C.      Gladis APPLETHWAITE et al. SANDREA. 7986;689(80): 2061-2068      (http://education.[facility].com/faq/FAQ164)  Chol/HDL Ratio <5.0 (calc) 3.4   Non HDL Cholesterol <130 mg/dL (calc) 98

## 2023-10-22 ENCOUNTER — Ambulatory Visit: Payer: Self-pay | Admitting: Family

## 2023-10-24 ENCOUNTER — Encounter: Payer: Self-pay | Admitting: Family

## 2023-10-27 ENCOUNTER — Telehealth (HOSPITAL_BASED_OUTPATIENT_CLINIC_OR_DEPARTMENT_OTHER): Payer: Self-pay

## 2023-10-27 ENCOUNTER — Encounter (HOSPITAL_BASED_OUTPATIENT_CLINIC_OR_DEPARTMENT_OTHER): Payer: Self-pay

## 2023-10-27 ENCOUNTER — Ambulatory Visit (HOSPITAL_BASED_OUTPATIENT_CLINIC_OR_DEPARTMENT_OTHER): Admission: RE | Admit: 2023-10-27 | Source: Ambulatory Visit

## 2023-11-26 ENCOUNTER — Encounter: Payer: Self-pay | Admitting: Family

## 2023-11-27 ENCOUNTER — Telehealth: Admitting: Physician Assistant

## 2023-11-27 DIAGNOSIS — B9689 Other specified bacterial agents as the cause of diseases classified elsewhere: Secondary | ICD-10-CM

## 2023-11-27 DIAGNOSIS — R051 Acute cough: Secondary | ICD-10-CM | POA: Diagnosis not present

## 2023-11-27 DIAGNOSIS — J019 Acute sinusitis, unspecified: Secondary | ICD-10-CM | POA: Diagnosis not present

## 2023-11-27 MED ORDER — AMOXICILLIN-POT CLAVULANATE 875-125 MG PO TABS: 1.0000 | ORAL_TABLET | Freq: Two times a day (BID) | ORAL | 0 refills | Status: DC

## 2023-11-27 MED ORDER — PREDNISONE 20 MG PO TABS: 40.0000 mg | ORAL_TABLET | Freq: Every day | ORAL | 0 refills | Status: DC

## 2023-11-27 MED ORDER — PROMETHAZINE-DM 6.25-15 MG/5ML PO SYRP: 5.0000 mL | ORAL_SOLUTION | Freq: Four times a day (QID) | ORAL | 0 refills | Status: DC | PRN

## 2023-11-27 NOTE — Patient Instructions (Signed)
 Selinda KATHEE Ku, thank you for joining Delon CHRISTELLA Dickinson, PA-C for today's virtual visit.  While this provider is not your primary care provider (PCP), if your PCP is located in our provider database this encounter information will be shared with them immediately following your visit.   A [facility] MyChart account gives you access to today's visit and all your visits, tests, and labs performed at [facility]  click here if you don't have a  MyChart account or go to [email or web address]  Consent: (Patient) Selinda KATHEE Ku provided verbal consent for this virtual visit at the beginning of the encounter.  Current Medications:  Current Outpatient Medications:    amoxicillin -clavulanate (AUGMENTIN) 875-125 MG tablet, Take 1 tablet by mouth 2 (two) times daily., Disp: 20 tablet, Rfl: 0   predniSONE  (DELTASONE ) 20 MG tablet, Take 2 tablets (40 mg total) by mouth daily with breakfast., Disp: 10 tablet, Rfl: 0   promethazine -dextromethorphan (PROMETHAZINE -DM) 6.25-15 MG/5ML syrup, Take 5 mLs by mouth 4 (four) times daily as needed., Disp: 118 mL, Rfl: 0   atorvastatin (LIPITOR) 20 MG tablet, Take 20 mg by mouth daily., Disp: , Rfl:    Azelastine -Fluticasone  137-50 MCG/ACT SUSP, Place 1 spray into the nose every 12 (twelve) hours., Disp: 23 g, Rfl: 3   benazepril (LOTENSIN) 40 MG tablet, Take by mouth., Disp: , Rfl:    cyclobenzaprine  (FLEXERIL ) 10 MG tablet, Take 0.5-1 tablets (5-10 mg total) by mouth at bedtime., Disp: 30 tablet, Rfl: 0   eletriptan  (RELPAX ) 20 MG tablet, TAKE 1 TABLET BY MOUTH AS NEEDED FOR MIGRAINE OR HEADACHE. MAY REPEAT IN 2 HRS IF PERSISTS OR RECURS, Disp: 12 tablet, Rfl: 0   hydroxychloroquine (PLAQUENIL) 200 MG tablet, Take 400 mg by mouth daily., Disp: , Rfl:    ketoconazole  (NIZORAL ) 2 % shampoo, Apply 1 Application topically as directed. Wash affected area daily until clear then weekly for prevention, Disp: 120 mL, Rfl: 6   levothyroxine   (SYNTHROID ) 50 MCG tablet, Take 1 tablet (50 mcg total) by mouth daily., Disp: 90 tablet, Rfl: 2   mycophenolate (CELLCEPT) 500 MG tablet, Take 1,000 mg by mouth 2 (two) times daily., Disp: , Rfl:    Nutritional Supplements (DHEA PO), Take 1 capsule by mouth daily., Disp: , Rfl:    rOPINIRole  (REQUIP ) 0.25 MG tablet, Take 1 tablet (0.25 mg total) by mouth at bedtime., Disp: 30 tablet, Rfl: 3   tadalafil  (CIALIS ) 5 MG tablet, Take 0.5-1 tablets (2.5-5 mg total) by mouth daily., Disp: 90 tablet, Rfl: 1   triamcinolone  cream (KENALOG ) 0.1 %, Apply 1 Application topically 2 (two) times daily as needed., Disp: 45 g, Rfl: 1   Medications ordered in this encounter:  Meds ordered this encounter  Medications   amoxicillin -clavulanate (AUGMENTIN) 875-125 MG tablet    Sig: Take 1 tablet by mouth 2 (two) times daily.    Dispense:  20 tablet    Refill:  0    Supervising Provider:   LAMPTEY, PHILIP O [[id number]]   predniSONE  (DELTASONE ) 20 MG tablet    Sig: Take 2 tablets (40 mg total) by mouth daily with breakfast.    Dispense:  10 tablet    Refill:  0    Supervising Provider:   LAMPTEY, PHILIP O [[id number]]   promethazine -dextromethorphan (PROMETHAZINE -DM) 6.25-15 MG/5ML syrup    Sig: Take 5 mLs by mouth 4 (four) times daily as needed.    Dispense:  118 mL    Refill:  0    Supervising Provider:  BLAISE ALEENE KIDD [[id number]]     *If you need refills on other medications prior to your next appointment, please contact your pharmacy*  Follow-Up: Call back or seek an in-person evaluation if the symptoms worsen or if the condition fails to improve as anticipated.  [facility] [phone]  Other Instructions Upper Respiratory Infection, Adult An upper respiratory infection (URI) is a common viral infection of the nose, throat, and upper air passages that lead to the lungs. The most common type of URI is the common cold. URIs usually get better on their own, without medical  treatment. What are the causes? A URI is caused by a virus. You may catch a virus by: Breathing in droplets from an infected person's cough or sneeze. Touching something that has been exposed to the virus (is contaminated) and then touching your mouth, nose, or eyes. What increases the risk? You are more likely to get a URI if: You are very young or very old. You have close contact with others, such as at work, school, or a health care facility. You smoke. You have long-term (chronic) heart or lung disease. You have a weakened disease-fighting system (immune system). You have nasal allergies or asthma. You are experiencing a lot of stress. You have poor nutrition. What are the signs or symptoms? A URI usually involves some of the following symptoms: Runny or stuffy (congested) nose. Cough. Sneezing. Sore throat. Headache. Fatigue. Fever. Loss of appetite. Pain in your forehead, behind your eyes, and over your cheekbones (sinus pain). Muscle aches. Redness or irritation of the eyes. Pressure in the ears or face. How is this diagnosed? This condition may be diagnosed based on your medical history and symptoms, and a physical exam. Your health care provider may use a swab to take a mucus sample from your nose (nasal swab). This sample can be tested to determine what virus is causing the illness. How is this treated? URIs usually get better on their own within 7-10 days. Medicines cannot cure URIs, but your health care provider may recommend certain medicines to help relieve symptoms, such as: Over-the-counter cold medicines. Cough suppressants. Coughing is a type of defense against infection that helps to clear the respiratory system, so take these medicines only as recommended by your health care provider. Fever-reducing medicines. Follow these instructions at home: Activity Rest as needed. If you have a fever, stay home from work or school until your fever is gone or until your  health care provider says your URI cannot spread to other people (is no longer contagious). Your health care provider may have you wear a face mask to prevent your infection from spreading. Relieving symptoms Gargle with a mixture of salt and water 3-4 times a day or as needed. To make salt water, completely dissolve -1 tsp (3-6 g) of salt in 1 cup (237 mL) of warm water. Use a cool-mist humidifier to add moisture to the air. This can help you breathe more easily. Eating and drinking  Drink enough fluid to keep your urine pale yellow. Eat soups and other clear broths. General instructions  Take over-the-counter and prescription medicines only as told by your health care provider. These include cold medicines, fever reducers, and cough suppressants. Do not use any products that contain nicotine or tobacco. These products include cigarettes, chewing tobacco, and vaping devices, such as e-cigarettes. If you need help quitting, ask your health care provider. Stay away from secondhand smoke. Stay up to date on all immunizations, including  the yearly (annual) flu vaccine. Keep all follow-up visits. This is important. How to prevent the spread of infection to others URIs can be contagious. To prevent the infection from spreading: Wash your hands with soap and water for at least 20 seconds. If soap and water are not available, use hand sanitizer. Avoid touching your mouth, face, eyes, or nose. Cough or sneeze into a tissue or your sleeve or elbow instead of into your hand or into the air.  Contact a health care provider if: You are getting worse instead of better. You have a fever or chills. Your mucus is brown or red. You have yellow or brown discharge coming from your nose. You have pain in your face, especially when you bend forward. You have swollen neck glands. You have pain while swallowing. You have white areas in the back of your throat. Get help right away if: You have shortness of  breath that gets worse. You have severe or persistent: Headache. Ear pain. Sinus pain. Chest pain. You have chronic lung disease along with any of the following: Making high-pitched whistling sounds when you breathe, most often when you breathe out (wheezing). Prolonged cough (more than 14 days). Coughing up blood. A change in your usual mucus. You have a stiff neck. You have changes in your: Vision. Hearing. Thinking. Mood. These symptoms may be an emergency. Get help right away. Call 911. Do not wait to see if the symptoms will go away. Do not drive yourself to the hospital. Summary An upper respiratory infection (URI) is a common infection of the nose, throat, and upper air passages that lead to the lungs. A URI is caused by a virus. URIs usually get better on their own within 7-10 days. Medicines cannot cure URIs, but your health care provider may recommend certain medicines to help relieve symptoms. This information is not intended to replace advice given to you by your health care provider. Make sure you discuss any questions you have with your health care provider. Document Revised: 08/23/2020 Document Reviewed: 08/23/2020 Elsevier Patient Education  2024 Elsevier Inc.   If you have been instructed to have an in-person evaluation today at a local Urgent Care facility, please use the link below. It will take you to a list of all of our available [facility], including address, phone number and hours of operation. Please do not delay care.  [facility]  If you or a family member do not have a primary care provider, use the link below to schedule a visit and establish care. When you choose a [facility] primary care physician or advanced practice provider, you gain a long-term partner in health. Find a Primary Care Provider  Learn more about [facility]'s in-office and virtual care options: [facility] - Get Care Now

## 2023-11-27 NOTE — Progress Notes (Signed)
 Virtual Visit Consent   Timothy Best, you are scheduled for a virtual visit with a Timothy Best provider today. Just as with appointments in the office, your consent must be obtained to participate. Your consent will be active for this visit and any virtual visit you may have with one of our providers in the next 365 days. If you have a MyChart account, a copy of this consent can be sent to you electronically.  As this is a virtual visit, video technology does not allow for your provider to perform a traditional examination. This may limit your provider's ability to fully assess your condition. If your provider identifies any concerns that need to be evaluated in person or the need to arrange testing (such as labs, EKG, etc.), we will make arrangements to do so. Although advances in technology are sophisticated, we cannot ensure that it will always work on either your end or our end. If the connection with a video visit is poor, the visit may have to be switched to a telephone visit. With either a video or telephone visit, we are not always able to ensure that we have a secure connection.  By engaging in this virtual visit, you consent to the provision of healthcare and authorize for your insurance to be billed (if applicable) for the services provided during this visit. Depending on your insurance coverage, you may receive a charge related to this service.  I need to obtain your verbal consent now. Are you willing to proceed with your visit today? Timothy Best has provided verbal consent on 11/27/2023 for a virtual visit (video or telephone). Delon CHRISTELLA Dickinson, PA-C  Date: 11/27/2023 9:14 AM   Virtual Visit via Video Note   I, Delon CHRISTELLA Dickinson, connected with  Timothy Best  ([id number], 10/30/1970) on 11/27/23 at  9:00 AM EDT by a video-enabled telemedicine application and verified that I am speaking with the correct person using two identifiers.  Location: Patient: Virtual Visit  Location Patient: Home Provider: Virtual Visit Location Provider: Home Office   I discussed the limitations of evaluation and management by telemedicine and the availability of in person appointments. The patient expressed understanding and agreed to proceed.    History of Present Illness: Timothy Best is a 48 y.o. who identifies as a male who was assigned male at birth, and is being seen today for URI symptoms.  HPI: URI  This is a new problem. The current episode started in the past 7 days. The problem has been gradually worsening. Maximum temperature: subjective fever overnight. Associated symptoms include chest pain (chest pressure not pain), congestion, coughing (dry), ear pain (left), headaches, nausea (yesterday, mild), rhinorrhea, sinus pain (face sore yesterday) and a sore throat (2 days ago, burning soreness, now impoved). Pertinent negatives include no diarrhea, plugged ear sensation, vomiting or wheezing. He has tried decongestant (sin-x, sudafed, ibuprofen ) for the symptoms. The treatment provided no relief.     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   Dermatitis,  eczematoid 01/15/2016   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) 06/23/2015   H/O long-term treatment with high-risk medication 04/03/2012   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    Allergies: No Known  Allergies Medications:  Current Outpatient Medications:    amoxicillin -clavulanate (AUGMENTIN) 875-125 MG tablet, Take 1 tablet by mouth 2 (two) times daily., Disp: 20 tablet, Rfl: 0   predniSONE  (DELTASONE ) 20 MG tablet, Take 2 tablets (40 mg total) by mouth daily with breakfast., Disp: 10 tablet, Rfl: 0   promethazine -dextromethorphan (PROMETHAZINE -DM) 6.25-15 MG/5ML syrup, Take 5 mLs by mouth 4 (four) times daily as needed., Disp: 118 mL, Rfl: 0   atorvastatin (LIPITOR) 20 MG tablet, Take 20 mg by mouth daily., Disp: , Rfl:    Azelastine -Fluticasone  137-50 MCG/ACT SUSP, Place 1 spray into the nose every 12 (twelve) hours., Disp: 23 g, Rfl: 3   benazepril (LOTENSIN) 40 MG tablet, Take by mouth., Disp: , Rfl:    cyclobenzaprine  (FLEXERIL ) 10 MG tablet, Take 0.5-1 tablets (5-10 mg total) by mouth at bedtime., Disp: 30 tablet, Rfl: 0   eletriptan  (RELPAX ) 20 MG tablet, TAKE 1 TABLET BY MOUTH AS NEEDED FOR MIGRAINE OR HEADACHE. MAY REPEAT IN 2 HRS IF PERSISTS OR RECURS, Disp: 12 tablet, Rfl: 0   hydroxychloroquine (PLAQUENIL) 200 MG tablet, Take 400 mg by mouth daily., Disp: , Rfl:    ketoconazole  (NIZORAL ) 2 % shampoo, Apply 1 Application topically as directed. Wash affected area daily until clear then weekly for prevention, Disp: 120 mL, Rfl: 6   levothyroxine  (SYNTHROID ) 50 MCG tablet, Take 1 tablet (50 mcg total) by mouth daily., Disp: 90 tablet, Rfl: 2   mycophenolate (CELLCEPT) 500 MG tablet, Take 1,000 mg by mouth 2 (two) times daily., Disp: , Rfl:    Nutritional Supplements (DHEA PO), Take 1 capsule by mouth daily., Disp: , Rfl:    rOPINIRole  (REQUIP ) 0.25 MG tablet, Take 1 tablet (0.25 mg total) by mouth at bedtime., Disp: 30 tablet, Rfl: 3   tadalafil  (CIALIS ) 5 MG tablet, Take 0.5-1 tablets (2.5-5 mg total) by mouth daily., Disp: 90 tablet, Rfl: 1   triamcinolone  cream (KENALOG ) 0.1 %, Apply 1 Application topically 2 (two) times daily as needed., Disp: 45 g, Rfl:  1  Observations/Objective: Patient is well-developed, well-nourished in no acute distress.  Resting comfortably at home.  Head is normocephalic, atraumatic.  No labored breathing.  Speech is clear and coherent with logical content.  Patient is alert and oriented at baseline.    Assessment and Plan: 1. Acute bacterial sinusitis (Primary) - amoxicillin -clavulanate (AUGMENTIN) 875-125 MG tablet; Take 1 tablet by mouth 2 (two) times daily.  Dispense: 20 tablet; Refill: 0  2. Acute cough - predniSONE  (DELTASONE ) 20 MG tablet; Take 2 tablets (40 mg total) by mouth daily with breakfast.  Dispense: 10 tablet; Refill: 0 - promethazine -dextromethorphan (PROMETHAZINE -DM) 6.25-15 MG/5ML syrup; Take 5 mLs by mouth 4 (four) times daily as needed.  Dispense: 118 mL; Refill: 0  - Worsening symptoms that have not responded to OTC medications.  - Will give Augmentin - Prednisone  and Promethazine  DM for cough - Continue allergy medications.  - Steam and humidifier can help - Stay well hydrated and get plenty of rest.  - Seek in person evaluation if no symptom improvement or if symptoms worsen   Follow Up Instructions: I discussed the assessment and treatment plan with the patient. The patient was provided an opportunity to ask questions and all were answered. The patient agreed with the plan and demonstrated an understanding of  the instructions.  A copy of instructions were sent to the patient via MyChart unless otherwise noted below.    The patient was advised to call back or seek an in-person evaluation if the symptoms worsen or if the condition fails to improve as anticipated.    Delon CHRISTELLA Dickinson, PA-C

## 2023-12-17 ENCOUNTER — Encounter: Payer: Self-pay | Admitting: Family

## 2023-12-17 ENCOUNTER — Ambulatory Visit: Admitting: Family

## 2023-12-17 ENCOUNTER — Telehealth: Payer: Self-pay | Admitting: Family

## 2023-12-17 VITALS — BP 112/61 | HR 71 | Temp 98.4°F | Resp 16 | Ht 72.0 in | Wt 186.6 lb

## 2023-12-17 DIAGNOSIS — G2581 Restless legs syndrome: Secondary | ICD-10-CM | POA: Diagnosis not present

## 2023-12-17 DIAGNOSIS — E010 Iodine-deficiency related diffuse (endemic) goiter: Secondary | ICD-10-CM

## 2023-12-17 DIAGNOSIS — F909 Attention-deficit hyperactivity disorder, unspecified type: Secondary | ICD-10-CM | POA: Diagnosis not present

## 2023-12-17 MED ORDER — AMPHETAMINE-DEXTROAMPHET ER 10 MG PO CP24: ORAL_CAPSULE | ORAL | 0 refills | Status: DC

## 2023-12-17 NOTE — Assessment & Plan Note (Signed)
 Uncontrolled on Requip  0.25mg . Will increase to 0.5mg  HS to see if this helps more.

## 2023-12-17 NOTE — Progress Notes (Signed)
 Subjective:     Patient ID: Timothy Best, male    DOB: 05/25/1975, 48 y.o.   MRN: [id number]  Chief Complaint  Patient presents with   ADHD    Patient would like to restart medication for condition    HPI  Discussed the use of AI scribe software for clinical note transcription with the patient, who gave verbal consent to proceed.  History of Present Illness Timothy Best is a 47 year old male who presents for medication management of restless leg syndrome and ADHD.  He has ongoing issues with restless leg syndrome. He has been trying a low dose of ropinirole  (0.25 mg) but has not noticed a significant difference in symptoms. He does not take it every night. Symptoms worsen on days when he is very active, particularly when he stops to sit or lie down, describing his legs as wanting to 'go, go, go'.  He has a history of ADHD and has previously taken Concerta, with the last dose being 36 mg. He did not notice a significant difference while on Concerta and has been off it for several months. He has also tried Wellbutrin  but did not find it effective for his ADHD symptoms. He is open to trying other medications. He mentions past difficulties with obtaining prescriptions due to issues between his neurologist and the pharmacy.      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    Past  Surgical History:  Procedure Laterality Date   NO PAST SURGERIES     RENAL BIOPSY     THYROIDECTOMY Left 12/22/2018   Procedure: HEMI THYROIDECTOMY;  Surgeon: Milissa Hamming, MD;  Location: [facility] OR[facility];  Service: ENT;  Laterality: Left;    Family History  Problem Relation Age of Onset   Allergies Mother    Clotting disorder Father    Allergies Brother     Social History   Socioeconomic History   Marital status: Married    Spouse name: Not on file   Number of children: Not on file   Years of education: Not on file   Highest education level: Associate degree: occupational, scientist, product/process development, or vocational program  Occupational History   Not on file  Tobacco Use   Smoking status: Former    Current packs/day: 1.00    Average packs/day: 1 pack/day for 12.0 years (12.0 ttl pk-yrs)    Types: Cigarettes   Smokeless tobacco: Never  Vaping Use   Vaping status: Never Used  Substance and Sexual Activity   Alcohol use: Yes    Comment: occasionally    Drug use: No   Sexual activity: Not on file  Other Topics Concern   Not on file  Social History Lawyer for [employer]   Married (second Marriage)   2 grown children first  marriage   1 child born 2020   Social Drivers of Health   Financial Resource Strain: Medium Risk (12/16/2023)   Overall Financial Resource Strain (CARDIA)    Difficulty of Paying Living Expenses: Somewhat hard  Food Insecurity: No Food Insecurity (12/16/2023)   Hunger Vital Sign    Worried About Running Out of Food in the Last Year: Never true    Ran Out of Food in the Last Year: Never true  Transportation Needs: No Transportation Needs (12/16/2023)   PRAPARE - Administrator, Civil Service (Medical): No    Lack of Transportation (Non-Medical): No  Physical Activity: Sufficiently Active (12/16/2023)   Exercise Vital Sign    Days of Exercise per Week: 5 days    Minutes of Exercise per Session: 60 min  Stress: No Stress Concern Present  (12/16/2023)   [employer] of Occupational Health - Occupational Stress Questionnaire    Feeling of Stress: Only a little  Recent Concern: Stress - Stress Concern Present (10/20/2023)   [employer] of Occupational Health - Occupational Stress Questionnaire    Feeling of Stress: To some extent  Social Connections: Moderately Isolated (12/16/2023)   Social Connection and Isolation Panel    Frequency of Communication with Friends and Family: Never    Frequency of Social Gatherings with Friends and Family: More than three times a week    Attends Religious Services: Never    Database Administrator or Organizations: No    Attends Engineer, Structural: Not on file    Marital Status: Married  Catering Manager Violence: Not on file    Outpatient Medications Prior to Visit  Medication Sig Dispense Refill   atorvastatin (LIPITOR) 20 MG tablet Take 20 mg by mouth daily.     Azelastine -Fluticasone  137-50 MCG/ACT SUSP Place 1 spray into the nose every 12 (twelve) hours. 23 g 3   benazepril (LOTENSIN) 40 MG tablet Take by mouth.     cyclobenzaprine  (FLEXERIL ) 10 MG tablet Take 0.5-1 tablets (5-10 mg total) by mouth at bedtime. 30 tablet 0   eletriptan  (RELPAX ) 20 MG tablet TAKE 1 TABLET BY MOUTH AS NEEDED FOR MIGRAINE OR HEADACHE. MAY REPEAT IN 2 HRS IF PERSISTS OR RECURS 12 tablet 0   hydroxychloroquine (PLAQUENIL) 200 MG tablet Take 400 mg by mouth daily.     levothyroxine  (SYNTHROID ) 50 MCG tablet Take 1 tablet (50 mcg total) by mouth daily. 90 tablet 2   mycophenolate (CELLCEPT) 500 MG tablet Take 1,000 mg by mouth 2 (two) times daily.     Nutritional Supplements (DHEA PO) Take 1 capsule by mouth daily.     promethazine -dextromethorphan (PROMETHAZINE -DM) 6.25-15 MG/5ML syrup Take 5 mLs by mouth 4 (four) times daily as needed. 118 mL 0   rOPINIRole  (REQUIP ) 0.25 MG tablet Take 1 tablet (0.25 mg total) by mouth at bedtime. 30 tablet 3   tadalafil  (CIALIS ) 5 MG tablet Take 0.5-1  tablets (2.5-5 mg total) by mouth daily. 90 tablet 1   triamcinolone  cream (KENALOG ) 0.1 % Apply 1 Application topically 2 (two) times daily as needed. 45 g 1   amoxicillin -clavulanate (AUGMENTIN) 875-125 MG tablet Take 1 tablet by mouth 2 (two) times daily. 20 tablet 0   ketoconazole  (NIZORAL ) 2 % shampoo Apply 1 Application topically as directed. Wash affected area daily until clear then weekly for prevention 120 mL 6   predniSONE  (DELTASONE ) 20 MG tablet Take 2 tablets (40 mg total) by mouth daily with breakfast. 10 tablet 0  No facility-administered medications prior to visit.    No Known Allergies  ROS    See HPI Objective:    Physical Exam Constitutional:      Appearance: Normal appearance.  Cardiovascular:     Rate and Rhythm: Normal rate.  Pulmonary:     Effort: Pulmonary effort is normal.  Neurological:     Mental Status: He is alert and oriented to person, place, and time.  Psychiatric:        Mood and Affect: Mood normal.        Behavior: Behavior normal.        Thought Content: Thought content normal.        Judgment: Judgment normal.      BP 112/61 (BP Location: Right Arm, Patient Position: Sitting, Cuff Size: Normal)   Pulse 71   Temp 98.4 F (36.9 C) (Oral)   Resp 16   Ht 6' (1.829 m)   Wt 186 lb 9.6 oz (84.6 kg)   SpO2 100%   BMI 25.31 kg/m  Wt Readings from Last 3 Encounters:  12/17/23 186 lb 9.6 oz (84.6 kg)  10/21/23 186 lb 6.4 oz (84.6 kg)  08/11/23 186 lb 9.6 oz (84.6 kg)       Assessment & Plan:   Problem List Items Addressed This Visit       Unprioritized   Thyromegaly   He is pricing out cost of US  at various locations and will let us  know where he decides to schedule.       RLS (restless legs syndrome)   Uncontrolled on Requip  0.25mg . Will increase to 0.5mg  HS to see if this helps more.      Attention deficit hyperactivity disorder (ADHD) - Primary    Concerta effective but discontinued in 2024 and now requires PA.  Wellbutrin  ineffective previously.  - Will prescribe trial of Adderall XR, one tablet in the morning for two weeks, then two tablets if tolerated. - Follow-up in one month to assess and adjust dosage. - Controlled substance contract signed. - Obtain  UDS.      Relevant Medications   amphetamine-dextroamphetamine (ADDERALL XR) 10 MG 24 hr capsule   Other Relevant Orders   DRUG MONITORING, PANEL 8 WITH CONFIRMATION, URINE   Assessment & Plan    I have discontinued Doryan B. Sanda's ketoconazole , amoxicillin -clavulanate, and predniSONE . I am also having him start on amphetamine-dextroamphetamine. Additionally, I am having him maintain his atorvastatin, hydroxychloroquine, Nutritional Supplements (DHEA PO), mycophenolate, benazepril, Azelastine -Fluticasone , triamcinolone  cream, levothyroxine , tadalafil , cyclobenzaprine , eletriptan , rOPINIRole , and promethazine -dextromethorphan.  Meds ordered this encounter  Medications   amphetamine-dextroamphetamine (ADDERALL XR) 10 MG 24 hr capsule    Sig: Take 1 tab by mouth once daily for 2 weeks, then increase to two tabs once daily on week 3    Dispense:  42 capsule    Refill:  0    Supervising Provider:   DOMENICA BLACKBIRD A [[id number]]

## 2023-12-17 NOTE — Telephone Encounter (Signed)
 I left a voicemail for pt letting them know we need to reschedule the appt with Eleanor Ponto today. The provider has allowed us  to override them onto her scheduled and the pt has been asked to call back and ask to speak with someone in the office so we can get the appt overrided. Or they can call and schedule for another day with provider that does not need someone in the office to schedule.

## 2023-12-17 NOTE — Assessment & Plan Note (Signed)
 He is pricing out cost of US  at various locations and will let us  know where he decides to schedule.

## 2023-12-17 NOTE — Assessment & Plan Note (Signed)
  Concerta effective but discontinued in 2024 and now requires PA. Wellbutrin  ineffective previously.  - Will prescribe trial of Adderall XR, one tablet in the morning for two weeks, then two tablets if tolerated. - Follow-up in one month to assess and adjust dosage. - Controlled substance contract signed. - Obtain  UDS.

## 2023-12-17 NOTE — Patient Instructions (Signed)
  VISIT SUMMARY: Today, you came in for medication management of your restless leg syndrome and ADHD. We discussed your current symptoms and reviewed your medication history.  YOUR PLAN: -RESTLESS LEGS SYNDROME: Restless legs syndrome is a condition that causes an uncontrollable urge to move your legs, usually due to an uncomfortable sensation. We will increase your dose of Rayquip to 0.5 mg nightly and evaluate its effectiveness at your next refill.  -ATTENTION-DEFICIT HYPERACTIVITY DISORDER (ADHD): ADHD is a condition characterized by symptoms of inattention, hyperactivity, and impulsivity. Since Concerta was not effective and Wellbutrin  did not help, we will start you on Adderall XR. Take one tablet in the morning for two weeks, and if you tolerate it well, increase to two tablets. We will follow up in one month to assess and adjust your dosage as needed. You have also signed a controlled substance contract.  INSTRUCTIONS: Please follow up in one month to assess the effectiveness of your new medications and adjust dosages as needed.                      Contains text generated by [employer]bridge.                                 Contains text generated by Abridge.

## 2023-12-19 LAB — DRUG MONITORING, PANEL 8 WITH CONFIRMATION, URINE
6 Acetylmorphine: NEGATIVE ng/mL (ref <10)
Alcohol Metabolites: NEGATIVE ng/mL (ref <500)
Amphetamines: NEGATIVE ng/mL (ref <500)
Benzodiazepines: NEGATIVE ng/mL (ref <100)
Buprenorphine, Urine: NEGATIVE ng/mL (ref <5)
Cocaine Metabolite: NEGATIVE ng/mL (ref <150)
Creatinine: 93 mg/dL (ref >=20.0)
MDMA: NEGATIVE ng/mL (ref <500)
Marijuana Metabolite: 37 ng/mL — ABNORMAL HIGH (ref <5)
Marijuana Metabolite: POSITIVE ng/mL — AB (ref <20)
Opiates: NEGATIVE ng/mL (ref <100)
Oxidant: NEGATIVE ug/mL (ref <200)
Oxycodone: NEGATIVE ng/mL (ref <100)
pH: 5.7 (ref 4.5–9.0)

## 2023-12-19 LAB — DM TEMPLATE

## 2023-12-27 ENCOUNTER — Telehealth: Admitting: Family Medicine

## 2023-12-27 DIAGNOSIS — B9689 Other specified bacterial agents as the cause of diseases classified elsewhere: Secondary | ICD-10-CM | POA: Diagnosis not present

## 2023-12-27 DIAGNOSIS — J019 Acute sinusitis, unspecified: Secondary | ICD-10-CM

## 2023-12-27 DIAGNOSIS — R051 Acute cough: Secondary | ICD-10-CM | POA: Diagnosis not present

## 2023-12-27 MED ORDER — AMOXICILLIN-POT CLAVULANATE 875-125 MG PO TABS: 1.0000 | ORAL_TABLET | Freq: Two times a day (BID) | ORAL | 0 refills | Status: AC

## 2023-12-27 MED ORDER — PROMETHAZINE-DM 6.25-15 MG/5ML PO SYRP: 5.0000 mL | ORAL_SOLUTION | Freq: Four times a day (QID) | ORAL | 0 refills | Status: DC | PRN

## 2023-12-27 MED ORDER — PREDNISONE 10 MG (21) PO TBPK: ORAL_TABLET | ORAL | 0 refills | Status: DC

## 2023-12-27 NOTE — Progress Notes (Signed)
 E-Visit for Sinus Problems  We are sorry that you are not feeling well.  Here is how we plan to help!  Based on what you have shared with me it looks like you have sinusitis.  Sinusitis is inflammation and infection in the sinus cavities of the head.  Based on your presentation I believe you most likely have Acute Bacterial Sinusitis.  This is an infection caused by bacteria and is treated with antibiotics. I have prescribed Promethazine  cough medicine, Augmentin  875mg /125mg  one tablet twice daily with food, for 7 days. As well as Prednisone  taper, please follow printed instructions. You may use an oral decongestant such as Mucinex D or if you have glaucoma or high blood pressure use plain Mucinex. Saline nasal spray help and can safely be used as often as needed for congestion.  If you develop worsening sinus pain, fever or notice severe headache and vision changes, or if symptoms are not better after completion of antibiotic, please schedule an appointment with a health care provider.    Sinus infections are not as easily transmitted as other respiratory infection, however we still recommend that you avoid close contact with loved ones, especially the very young and elderly.  Remember to wash your hands thoroughly throughout the day as this is the number one way to prevent the spread of infection!  Home Care: Only take medications as instructed by your medical team. Complete the entire course of an antibiotic. Do not take these medications with alcohol. A steam or ultrasonic humidifier can help congestion.  You can place a towel over your head and breathe in the steam from hot water coming from a faucet. Avoid close contacts especially the very young and the elderly. Cover your mouth when you cough or sneeze. Always remember to wash your hands.  Get Help Right Away If: You develop worsening fever or sinus pain. You develop a severe head ache or visual changes. Your symptoms persist after  you have completed your treatment plan.  Make sure you Understand these instructions. Will watch your condition. Will get help right away if you are not doing well or get worse.  Your e-visit answers were reviewed by a board certified advanced clinical practitioner to complete your personal care plan.  Depending on the condition, your plan could have included both over the counter or prescription medications.  If there is a problem please reply  once you have received a response from your provider.  Your safety is important to us .  If you have drug allergies check your prescription carefully.    You can use MyChart to ask questions about today's visit, request a non-urgent call back, or ask for a work or school excuse for 24 hours related to this e-Visit. If it has been greater than 24 hours you will need to follow up with your provider, or enter a new e-Visit to address those concerns.  You will get an e-mail in the next two days asking about your experience.  I hope that your e-visit has been valuable and will speed your recovery. Thank you for using e-visits.  I have spent 5 minutes in review of e-visit questionnaire, review and updating patient chart, medical decision making and response to patient.   Roosvelt Mater, PA-C

## 2024-01-17 ENCOUNTER — Other Ambulatory Visit: Payer: Self-pay | Admitting: Family

## 2024-01-17 DIAGNOSIS — G2581 Restless legs syndrome: Secondary | ICD-10-CM

## 2024-01-30 ENCOUNTER — Encounter: Payer: Self-pay | Admitting: Family

## 2024-01-30 ENCOUNTER — Other Ambulatory Visit: Payer: Self-pay | Admitting: Family

## 2024-01-30 DIAGNOSIS — N529 Male erectile dysfunction, unspecified: Secondary | ICD-10-CM

## 2024-01-30 MED ORDER — TADALAFIL 5 MG PO TABS: 2.5000 mg | ORAL_TABLET | Freq: Every day | ORAL | 1 refills | Status: AC

## 2024-02-06 ENCOUNTER — Telehealth: Admitting: Physician Assistant

## 2024-02-06 DIAGNOSIS — B9789 Other viral agents as the cause of diseases classified elsewhere: Secondary | ICD-10-CM

## 2024-02-06 NOTE — Progress Notes (Signed)
 Because you have had recurrent symptoms and recently had treatment for similar issues in November, I feel your condition warrants further evaluation and I recommend that you be seen for a face to face visit.  Please contact your primary care physician practice to be seen. Many offices offer virtual options to be seen via video if you are not comfortable going in person to a medical facility at this time.  NOTE: You will NOT be charged for this eVisit.  If you do not have a PCP, [facility] offers a free physician referral service available at [phone]. Our trained staff has the experience, knowledge and resources to put you in touch with a physician who is right for you.   If you are having a true medical emergency, please call 911.     For an urgent face to face visit, [facility] has multiple urgent care centers for your convenience.  Click the link below for the full list of locations and hours, walk-in wait times, appointment scheduling options and driving directions:  Urgent Care - [place], [place], [place], [place], [place], [place]  [place]  Your MyChart E-visit questionnaire answers were reviewed by a board certified advanced clinical practitioner to complete your personal care plan based on your specific symptoms.    Thank you for using e-Visits.

## 2024-02-09 ENCOUNTER — Ambulatory Visit: Payer: Self-pay

## 2024-02-09 ENCOUNTER — Ambulatory Visit: Admitting: Internal Medicine

## 2024-02-09 ENCOUNTER — Encounter: Payer: Self-pay | Admitting: Internal Medicine

## 2024-02-09 VITALS — BP 140/84 | HR 86 | Temp 98.3°F | Resp 16 | Ht 72.0 in | Wt 192.4 lb

## 2024-02-09 DIAGNOSIS — R051 Acute cough: Secondary | ICD-10-CM

## 2024-02-09 DIAGNOSIS — I10 Essential (primary) hypertension: Secondary | ICD-10-CM | POA: Diagnosis not present

## 2024-02-09 DIAGNOSIS — J40 Bronchitis, not specified as acute or chronic: Secondary | ICD-10-CM

## 2024-02-09 MED ORDER — PROMETHAZINE-DM 6.25-15 MG/5ML PO SYRP: 5.0000 mL | ORAL_SOLUTION | Freq: Four times a day (QID) | ORAL | 0 refills | Status: AC | PRN

## 2024-02-09 MED ORDER — AZELASTINE HCL 0.1 % NA SOLN: 1.0000 | Freq: Two times a day (BID) | NASAL | 1 refills | Status: AC

## 2024-02-09 MED ORDER — AZITHROMYCIN 250 MG PO TABS: ORAL_TABLET | ORAL | 0 refills | Status: AC

## 2024-02-09 NOTE — Telephone Encounter (Signed)
"  Appt scheduled  "

## 2024-02-09 NOTE — Progress Notes (Signed)
 "  Subjective:    Patient ID: Timothy Best, male    DOB: Nov 16, 1975, 49 y.o.   MRN: [id number]  DOS:  02/09/2024 Acute  Discussed the use of AI scribe software for clinical note transcription with the patient, who gave verbal consent to proceed.  History of Present Illness Timothy Best is a 49 year old male with lupus and idiopathic nephritis who presents with cough and chest congestion.  Upper and lower respiratory symptoms - Illness began approximately one week ago with sore throat, progressing to heavy nasal and sinus congestion. - Currently has chest congestion and frequent cough, which is worse at night and disrupts sleep. - Prominent postnasal drip present. - No shortness of breath, fever, or chills. - Chest pressure is felt centrally over the sternum, worsened with breathing and attributed to coughing.  Immunosuppression and medication use - Immunosuppressed due to underlying lupus and idiopathic nephritis. - Takes Plaquenil and CellCept. - Completed a prednisone  course in November for sinus infection; not currently on prednisone . - Has promethazine  DM syrup available, which helped a similar prior episode, but has not used it for this illness. - Currently taking Sudafed for congestion.  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   eletriptan  (RELPAX ) 20 MG tablet TAKE 1 TABLET BY MOUTH AS NEEDED FOR MIGRAINE OR HEADACHE. MAY REPEAT IN 2 HRS IF PERSISTS OR RECURS   hydroxychloroquine (PLAQUENIL) 400 mg, Daily   levothyroxine  (SYNTHROID ) 50 mcg, Oral, Daily   mycophenolate (CELLCEPT) 1,000 mg, 2 times daily   Nutritional Supplements (DHEA PO) 1 capsule, Daily   predniSONE  (STERAPRED UNI-PAK 21 TAB) 10 MG (21) TBPK tablet Take following package directions.   promethazine -dextromethorphan (PROMETHAZINE -DM) 6.25-15 MG/5ML syrup 5 mLs, Oral, 4 times daily PRN   rOPINIRole  (REQUIP ) 0.25 mg, Oral, Daily at bedtime   tadalafil  (CIALIS ) 2.5-5 mg, Oral, Daily   triamcinolone  cream (KENALOG ) 0.1 % 1 Application, Topical, 2 times daily PRN       Objective:   Physical Exam BP (!) 144/92   Pulse 86   Temp 98.3 F (36.8 C) (Oral)   Resp 16   Ht 6' (1.829 m)   Wt 192 lb 6 oz (87.3 kg)   SpO2 96%   BMI 26.09 kg/m  General:   Well developed, NAD, BMI noted. HEENT:  Normocephalic . Face symmetric, atraumatic.  TMs normal.  Nose slightly congested.  Sinuses no TTP Lungs:  CTA B Normal respiratory effort, no intercostal retractions, no accessory muscle use. Heart: RRR,  no murmur.  Lower extremities: no pretibial edema bilaterally  Skin: Not pale. Not jaundice Neurologic:  alert & oriented  X3.  Speech normal, gait appropriate for age and unassisted Psych--  Cognition and judgment appear intact.  Cooperative with normal attention span and concentration.  Behavior appropriate. No anxious or depressed appearing.      Assessment   49 year old male, PMH includes HTN, high cholesterol, hypothyroidism, immunosuppressed SLE on CellCept   Assessment & Plan Acute bronchitis Mild bronchitis with productive cough and chest pressure. Immunosuppressed due to systemic lupus  erythematosus, increasing risk for bacterial infection.  Plan: - Prescribed Zithromax , no interactions noted. - Refilled promethazine  DM syrup for cough relief. - Recommended Astepro  nasal spray, two squirts in each nostril twice daily. - Advised increased fluid intake. - Instructed to report if symptoms do not improve. SLE with glomerular disease Managed with Plaquenil and CellCept.  HTN Blood pressure at 144/92 upon arrival, possibly elevated due to Sudafed and caffeine intake. No ambulatory BPs but is typically okay. BP recheck: 140/84 Recommend to avoid decongestants   "

## 2024-02-09 NOTE — Patient Instructions (Signed)
" °  °  ACUTE BRONCHITIS:  You have mild bronchitis with a productive cough and chest pressure. Because you are immunosuppressed, there is a higher risk of bacterial infection. -You are prescribed Zithromax  to treat a potential bacterial infection. -You can use promethazine  DM syrup for cough relief. -Use Astepro  nasal spray, two squirts in each nostril twice daily. -Increase your fluid intake. -Report back if your symptoms do not improve.     HYPERTENSION: Your blood pressure was 144/92 today, which might be elevated due to Sudafed and caffeine intake. -Try to reduce your caffeine intake.                      Contains text generated by Abridge.                                 Contains text generated by Abridge.   "

## 2024-02-09 NOTE — Telephone Encounter (Signed)
 FYI Only or Action Required?: FYI only for provider: appointment scheduled on 1.5.26.  Patient was last seen in primary care on 12/17/2023 by Daryl Setter, NP.  Called Nurse Triage reporting Cough.  Symptoms began a week ago.  Interventions attempted: Rest, hydration, or home remedies.  Symptoms are: gradually worsening.  Triage Disposition: See HCP Within 4 Hours (Or PCP Triage)  Patient/caregiver understands and will follow disposition?: Yes    Copied from CRM [id number]. Topic: Clinical - Red Word Triage >> Feb 09, 2024  8:55 AM Adelita BRAVO wrote: [facility] that prompted transfer to Nurse Triage: Worsening cold symptoms going on for a week. Congestion, green blood-tinged mucous, and cough leading to chest pain. Reason for Disposition  [1] MILD difficulty breathing (e.g., minimal/no SOB at rest, SOB with walking, pulse < 100) AND [2] still present when not coughing  Answer Assessment - Initial Assessment Questions Sore throat started last Monday, cough last couple days more congested. Blowing out green mucus and blood- small amout. Cough is a darker color and harder to get up. Pain in center of his chest with some tightness and mild shortness of breath. He is immunosupressed.     1. ONSET: When did the cough begin?      About a week ago 2. SEVERITY: How bad is the cough today?      moderate 3. SPUTUM: Describe the color of your sputum (e.g., none, dry cough; clear, white, yellow, green)     green 4. HEMOPTYSIS: Are you coughing up any blood? If Yes, ask: How much? (e.g., flecks, streaks, tablespoons, etc.)     slight 5. DIFFICULTY BREATHING: Are you having difficulty breathing? If Yes, ask: How bad is it? (e.g., mild, moderate, severe)      mild 6. FEVER: Do you have a fever? If Yes, ask: What is your temperature, how was it measured, and when did it start?     denies 7. CARDIAC HISTORY: Do you have any history of heart disease? (e.g., heart attack,  congestive heart failure)      htn 8. LUNG HISTORY: Do you have any history of lung disease?  (e.g., pulmonary embolus, asthma, emphysema)      9. OTHER SYMPTOMS: Do you have any other symptoms? (e.g., runny nose, wheezing, chest pain)       Chest tightness central  Protocols used: Cough - Acute Productive-A-AH

## 2024-02-17 ENCOUNTER — Other Ambulatory Visit: Payer: Self-pay | Admitting: Family

## 2024-02-17 DIAGNOSIS — G43109 Migraine with aura, not intractable, without status migrainosus: Secondary | ICD-10-CM

## 2024-02-17 MED ORDER — ELETRIPTAN HYDROBROMIDE 20 MG PO TABS: ORAL_TABLET | ORAL | 2 refills | Status: AC

## 2024-02-17 NOTE — Telephone Encounter (Signed)
 Copied from CRM [id number]. Topic: Clinical - Medication Refill >> Feb 17, 2024  2:18 PM Deaijah H wrote: Medication: eletriptan  (RELPAX ) 20 MG tablet  Has the patient contacted their pharmacy? Yes (Agent: If no, request that the patient contact the pharmacy for the refill. If patient does not wish to contact the pharmacy document the reason why and proceed with request.) That they've sent it but no response back (Agent: If yes, when and what did the pharmacy advise?)  This is the patient's preferred pharmacy:  [facility] [address] [place] - [address] [place] [address] Phone: [phone] Fax: [phone]  Is this the correct pharmacy for this prescription? Yes If no, delete pharmacy and type the correct one.   Has the prescription been filled recently? No  Is the patient out of the medication? No  Has the patient been seen for an appointment in the last year OR does the patient have an upcoming appointment? Yes  Can we respond through MyChart? Yes  Agent: Please be advised that Rx refills may take up to 3 business days. We ask that you follow-up with your pharmacy.

## 2024-03-03 ENCOUNTER — Other Ambulatory Visit: Payer: Self-pay

## 2024-03-03 DIAGNOSIS — E890 Postprocedural hypothyroidism: Secondary | ICD-10-CM

## 2024-03-03 MED ORDER — LEVOTHYROXINE SODIUM 50 MCG PO TABS: 50.0000 ug | ORAL_TABLET | Freq: Every day | ORAL | 2 refills | Status: AC
